# Patient Record
Sex: Female | Born: 1945 | Race: White | Hispanic: No | Marital: Married | Smoking: Never smoker
Health system: Southern US, Community
[De-identification: ages and names within clinical notes are randomized; demographics above are authoritative.]

## PROBLEM LIST (undated history)

## (undated) DIAGNOSIS — R413 Other amnesia: Secondary | ICD-10-CM

## (undated) DIAGNOSIS — R410 Disorientation, unspecified: Secondary | ICD-10-CM

## (undated) DIAGNOSIS — E119 Type 2 diabetes mellitus without complications: Secondary | ICD-10-CM

## (undated) DIAGNOSIS — M1990 Unspecified osteoarthritis, unspecified site: Secondary | ICD-10-CM

## (undated) DIAGNOSIS — R450 Nervousness: Secondary | ICD-10-CM

## (undated) HISTORY — DX: Unspecified osteoarthritis, unspecified site: M19.90

## (undated) HISTORY — DX: Type 2 diabetes mellitus without complications: E11.9

## (undated) HISTORY — DX: Other amnesia: R41.3

## (undated) HISTORY — DX: Disorientation, unspecified: R41.0

## (undated) HISTORY — DX: Nervousness: R45.0

---

## 2007-11-07 ENCOUNTER — Ambulatory Visit: Payer: Self-pay | Admitting: Obstetrics and Gynecology

## 2007-12-12 ENCOUNTER — Encounter (INDEPENDENT_AMBULATORY_CARE_PROVIDER_SITE_OTHER): Payer: Self-pay | Admitting: Gynecology

## 2007-12-12 ENCOUNTER — Ambulatory Visit: Payer: Self-pay | Admitting: Gynecology

## 2008-01-03 ENCOUNTER — Inpatient Hospital Stay (HOSPITAL_COMMUNITY): Admission: RE | Admit: 2008-01-03 | Discharge: 2008-01-04 | Payer: Self-pay | Admitting: Gynecology

## 2008-01-03 ENCOUNTER — Encounter (INDEPENDENT_AMBULATORY_CARE_PROVIDER_SITE_OTHER): Payer: Self-pay | Admitting: Gynecology

## 2008-01-03 ENCOUNTER — Ambulatory Visit: Payer: Self-pay | Admitting: Gynecology

## 2008-01-09 ENCOUNTER — Ambulatory Visit: Payer: Self-pay | Admitting: Gynecology

## 2008-02-20 ENCOUNTER — Ambulatory Visit: Payer: Self-pay | Admitting: Obstetrics and Gynecology

## 2011-01-11 NOTE — Consult Note (Signed)
NAME:  Joyce Perry, Joyce Perry NO.:  [id number]   MEDICAL RECORD NO.:  [id number]          PATIENT TYPE:  WOC   LOCATION:  WOC                          FACILITY:  [facility]   PHYSICIAN:  Maylon Cos, C.N.M. DATE OF BIRTH:  02/10/1946   DATE OF CONSULTATION:  DATE OF DISCHARGE:                                 CONSULTATION   The patient presents today to the GYN Clinic at [facility] on  November 07, 2007 as a self referral patient.   CHIEF COMPLAINT:  Something is coming out 3-4 times a week between her  legs. The patient states that she has been noticing this problem for  approximately 3-4 months, and it happens 3-4 times a week. She denies  any pain, any vaginal bleeding, vaginal discharge, or vaginal  irritation. Reviewing her gynecological history, she has no known  allergies. She is not currently taking any medications. Her  immunizations are not up to date. She went through menopause  approximately 11 years ago. She has never been on any contraceptives.  She has been pregnant twice and had two vaginal deliveries, the last of  which was in 1967. It has been several years since her last Pap smear.  She has never had a mammogram or a colonoscopy. She has no history of  any operations. Her family history is significant for diabetes in both  of her parents, her grandparents, and her sister, and she has a  significant history for high blood pressure in her grandmother. Personal  medical history is only positive for what she thinks is arthritis;  however, she has never sought medical attention for any of this, and  this is a self diagnosis.   SOCIAL HISTORY:  She is married and has been  married for 46 years to  her husband. She does not currently work outside of the home. She does  not smoke. She does not drink alcohol. She does not drink caffeinated  beverages. She is not being sexually or physically abused. She does not  have any substance or abuse issues.   PHYSICAL EXAMINATION:  Her exam is going to be problem focused, and we  will attempt to address her other health issues and update her on all of  her health maintenance delinquencies at her next visit.  VITAL SIGNS:  On exam today, her temperature is 97. Her pulse is 110.  Her blood pressure is elevated at 180/104, and then on recheck was found  to be 172/106. Her weight today is 130, and her height is 5 feet 2-1/2  inches.  GENERAL:  On exam, Joyce Perry is a well-dressed, pleasant Caucasian  female who appears to be her stated age. She is no apparent distress.  ABDOMINAL:  Negative. She has no masses, nontender.  EXTERNAL GENITALIA:  She is a Tanner V. She has no lesions. Mucous  membranes are atrophic but nonerythematous. Vagina is smooth.  SPECULUM EXAM:  Her cervix is easily visualized using a Graves speculum.  Her tone is lax. Upon bearing down, the patient has a significant  cystocele which is able to be reproduced on several  occasions. The  patient was also noted to have several hemorrhoids rectally. Dr.  Mia Creek was consulted concerning this patient, and he also examined the  patient and confirmed the findings of significant cystocele with little  evidence of rectocele in patient and agreed with diagnosis of  hemorrhoids as well.   ASSESSMENT AND PLAN:  Joyce Perry is a 65 year old Caucasian female.  1. Cystocele. Options for management were reviewed at length by myself      and also Dr. Mia Creek. The patient was given option since she is      not having any problems and is concerned about financial need that      it is not emergent for her to have the cystocele reversed or      corrected seeing that she is currently stable and not having any      complaints related to it. The option of pessary was also discussed,      and the option of surgery was also discussed. Multiple questions      were answered, and the patient would like to proceed with surgery;      however, would like to  investigate the option of Medicaid coverage.      The patient was referred to the Department of Social Services to      apply for Medicaid coverage in the meantime until she will be      eligible for Medicare at the age of 63 in approximately 11 months.  2. Hypertension. It is unclear whether this patient has significant      chronic hypertension or whether as she states this is a white coat      syndrome and it happens any time she interacts with medical care.      The patient was referred to [facility] for general medical workup      and clearance for surgery seeing that it has been 11 years since      her last physical exam and evaluation of her hypertension.  3. Health maintenance. The patient is to return in 6-8 weeks to      discuss her plans and to assess her situation as far as health      insurance coverage. We have also referred her to the financial      counselor to discuss the self pay option for surgical repair of the      cystocele. It was reviewed that the patient should have a      colonoscopy, mammography, and Pap smear. The patient was referred      to the free Pap smear clinic that is being provided by [facility]      over the next several weeks. The patient was also given information      to apply for a scholarship for a free mammogram and was given      information for area colorectal screenings secondary to her      financial. The patient does, however, plan to apply for Medicaid,      and in that case that she does not want to pay out of pocket for      these services, she was instructed to return when she does have      health insurance coverage, public or private, for annual exam and      scheduling of these appointments.      Maylon Cos, C.N.M.     SS/MEDQ  D:  11/07/2007  T:  11/07/2007  Job:  [id number]

## 2011-01-11 NOTE — Group Therapy Note (Signed)
NAME:  Joyce Perry, Joyce Perry NO.:  [id number]   MEDICAL RECORD NO.:  [id number]          PATIENT TYPE:  WOC   LOCATION:  [facility]                   FACILITY:  [facility]   PHYSICIAN:  Ginger Carne, MD DATE OF BIRTH:  [birth year]   DATE OF SERVICE:  12/12/2007                                  CLINIC NOTE   This patient is a 65 year old Caucasian female who was initially seen in  the GYN clinic on 11/07/2007, with Maylon Cos, CNM.  The patient has  complained of vaginal bulging and symptoms of genuine urinary stress  incontinence.  A thorough history and physical is described in Ms.  Shores' note, dated 11/07/2007, and this is an addendum.   I reexplored the options with the patient which were briefly described  at the prior visit due to my time limitation secondary to seeing the  patient between surgeries as to the use of a pessary as opposed to  definitive surgery.  I explained to the patient that because she is  active, there is a chance that the pessary may not remain in place.  Today, I made it very clear that it is a viable option if it can be  sized correctly, but that there are no assurances it may not fall out.  This was briefly discussed with the patient at her prior visit.  Husband  and patient had time to think about this and have elected not to proceed  with a pessary, although I think this would be a fine option for her.   The patient loses urine on coughing, straining and other Valsalva  maneuvers.  She denies loss of urine at rest.  She has no nocturia, has  never had bladder surgery.  Does not take medication that would enhance  her propensity for losing urine and has no neurological or other medical  diseases that would contribute to her urinary loss.  She has no fecal  incontinence.   The patient describes minimal, but present postvoid dribbling.  In  addition, she states that she has noticed significant irritation of the  vaginal canal.   Physical exam reveals external genitalia, vulva and vagina present  second-degree uterine prolapse with a third-degree cystocele, first-  degree rectocele and no evidence of an enterocele.  Upon standing, these  findings are significantly more noticeable than on the recumbent supine  position.  Rectal exam reveals good tone.  Uterus is small, anteverted  and flexed and both adnexa are not palpable.   After once again describing the pros and cons of proceeding with a  pessary, the patient and husband would prefer definitive surgery.  I  think her best option would be a tension-free vaginal tape procedure  with cystoscopy, a total vaginal hysterectomy with bilateral salpingo-  oophorectomy, an anterior colporrhaphy and uterosacral ligament vaginal  vault suspension.  The nature of said procedure discussed in detail,  including possible injury to bowel, hemorrhage possibly requiring a  blood transfusion, injury to ureter and bladder, postoperative urinary  urgency,  urinary retention and graft complications, including erosion, infection  or rejection.  The patient accepts said complications, understands  said  surgery with husband and will proceed with same.           ______________________________  Ginger Carne, MD     SHB/MEDQ  D:  12/12/2007  T:  12/12/2007  Job:  [id number]

## 2011-01-11 NOTE — Discharge Summary (Signed)
NAMEDALY, Joyce Perry              ACCOUNT NO.:  [id number]   MEDICAL RECORD NO.:  [id number]          PATIENT TYPE:  WOC   LOCATION:  WOC                          FACILITY:  [facility]   PHYSICIAN:  Ginger Carne, MD  DATE OF BIRTH:  May 31, 1946   DATE OF ADMISSION:  DATE OF DISCHARGE:                               DISCHARGE SUMMARY   REASON FOR HOSPITALIZATION:  Third-degree uterovaginal prolapse, third-  degree cystocele, and genuine urinary stress incontinence.   IN-HOSPITAL PROCEDURES:  1. Total vaginal hysterectomy.  2. Uterosacral ligament vaginal vault suspension.  3. Tension-free vaginal tape procedure.  4. Cystoscopy.  5. Anterior colporrhaphy.   FINAL DIAGNOSES:  1. Third-degree uterovaginal prolapse.  2. Third-degree cystocele.  3. Genuine urinary stress incontinence.   HOSPITAL COURSE:  This patient is a 65 year old Caucasian female who  underwent the aforementioned procedures on Jan 03, 2008.  Her  postoperative course was unremarkable.  She was afebrile.  Hemoglobin  10.3 from 13.0 preop and creatinine 0.61.  She has scant vaginal flow.  Lungs clear.  Abdomen soft.  Calves were without tenderness.  The  patient will have her Foley catheter removed in the a.m. and will  have  a voiding trial.  If she is unable to void and have a residual urine  volume less than 75 mL, her Foley catheter will be replaced with a leg  bag and return on Jan 09, 2008, at 1 p.m. at the [facility] for removal.  Otherwise, the patient will have her catheter removed, able to void and  was instructed regarding voiding trials every 4 hours for the next few  weeks to avoid urinary overflow.   Other instructions including contacting the office for temperature  elevation above 104 degrees Fahrenheit, increasing abdominal or pelvic  pain, vaginal bleeding, GI or GU complaints, urinary retention, or  difficulty with bowel movements.  She was prescribed Percocet 5/325 1-2  every 4-6 hours for  pain, ibuprofen as needed, and Levaquin 500 mg 1  daily for 7 days.  If the patient is able to have the satisfactory void  trail this morning, she will return at 4 weeks to the GYN Clinic.      Ginger Carne, MD  Electronically Signed     SHB/MEDQ  D:  01/04/2008  T:  01/04/2008  Job:  [id number]

## 2011-01-11 NOTE — Op Note (Signed)
Joyce Perry, Joyce Perry              ACCOUNT NO.:  [id number]   MEDICAL RECORD NO.:  [id number]          PATIENT TYPE:  INP   LOCATION:  9319                          [facility]   PHYSICIAN:  Ginger Carne, MD  DATE OF BIRTH:  [birth year]   DATE OF PROCEDURE:  01/03/2008  DATE OF DISCHARGE:                               OPERATIVE REPORT   PREOPERATIVE DIAGNOSES:  1. Third-degree uterovaginal prolapse.  2. Third-degree cystocele.  3. Genuine urinary stress incontinence.   POSTOPERATIVE DIAGNOSES:  1. Third-degree uterovaginal prolapse.  2. Third-degree cystocele.  3. Genuine urinary stress incontinence.   PROCEDURE:  1. Total vaginal hysterectomy.  2. Uterosacral ligament vaginal vault suspension.  3. Tension-free vaginal tape procedure with cystoscopy.   SURGEON:  Ginger Carne, MD   ASSISTANT:  Javier Glazier. Rose, MD.   ESTIMATED BLOOD LOSS:  150 mL.   COMPLICATIONS:  None immediate.   SPECIMEN:  Uterus and cervix.   DISPOSITION:  To pathology.   OPERATIVE FINDINGS:  The patient demonstrated a third-degree  uterovaginal prolapse, third-degree cystocele, first-degree rectocele,  and history of genuine urinary stress incontinence.  The uterus was  atrophic with a long postmenopausal cervix.  Both tubes and ovaries  appeared normal and left in situ due to their high position.   OPERATIVE PROCEDURE:  The patient was prepped and draped in usual  fashion and placed in lithotomy position.  Betadine solution was used  for antiseptic and the patient was catheterized prior to the procedure.  After adequate general anesthesia, a tenaculum was placed on the  anterior posterior lips of the cervix.  Marcaine with epinephrine was  used as a paracervical block.  2 cm of anterior and posterior vaginal  epithelium were incised transversely.  The peritoneal reflections were  identified and opened without injury to their respective organs.  Uterosacral-cardinal ligament complex was  clamped, cut, and ligated with  0 Vicryl suture.  This extended to the uterine vasculature in a standard  fashion.  At this point, the utero-ovarian ligaments including the round  ligament and tubes were clamped, cut, and ligated with 0 Vicryl suture  in a transfixation manner on either side.  No active bleeding was noted.   The uterosacral ligament vaginal vault suspension was then carried out  by placing an index finger in the rectum.  The said ligaments were  identified at the 5 and 7 o'clock position.  Double looped 0 Prolene  placed on either side.  Immediately afterwards cystoscopy with indigo  carmine was performed.  Tension was placed on said sutures and good  spillage of dye through both ureteral orifices noted.  Afterwards, the  sutures were left on hemostats to be later affixed to the posterior  aspect of the vaginal epithelium to their respective peritoneum at the 5  and 7 o'clock positions.   An anterior colporrhaphy was performed by dividing the anterior vaginal  epithelium in the midline.  The pubovesical cervical fascia was then  dissected off said epithelium.  The space of Retzius was then identified  and using a [employer] scientific advantage bottom up technique,  the TVT  tapes were then placed about 1 cm lateral from the symphysis pubis and  emanated in the pelvic region hugging the posterior aspect of the pubic  bone.  Immediate cystoscopy was then performed.  No injury was noted to  the urethra, bladder sidewalls, trigone, dome, or lateral walls of the  bladder.  At this point, a standard anterior colporrhaphy approximating  the pubovesical cervical fashion in the midline with 2-0 Prolene  interrupted sutures was performed.  Afterwards appropriate tensioning  with the TVT tape was then performed.  The plastic sheaths were then  removed.  There was an adequate loop beneath the said urethra in the  midportion of the urethra.  Following this, bleeding points   hemostatically were checked.  Copious irrigation with lactated Ringer's  followed.  Minimal trimming was necessary to the anterior vaginal  epithelium.  At this point, the anterior vaginal epithelium was closed  with 0 Monocryl running interlocking suture.  As described above, the  uterosacral ligament sutures were then affixed to the posterior vaginal  epithelium and their respective peritoneal reflections at the 5 and 7  o'clock positions.  Closure of the cuff was with a 0 Vicryl running  interlocking suture.  Afterwards, the Prolene sutures were then tied up  to the uterosacral ligaments on either side.  No active bleeding noted.  Urine was clear at the end of the procedure.  The patient tolerated the  procedure well and returned to postanesthesia recovery room in excellent  condition.      Ginger Carne, MD  Electronically Signed     SHB/MEDQ  D:  01/03/2008  T:  01/04/2008  Job:  [id number]

## 2011-01-11 NOTE — Group Therapy Note (Signed)
NAMEHALIMA, [name] NO.:  [id number]   MEDICAL RECORD NO.:  [id number]          PATIENT TYPE:  WOC   LOCATION:  [facility]                   [facility]ACILITY:  [facility]   PHYSICIAN:  Argentina Donovan, MD        DATE OF BIRTH:  1946/08/16   DATE OF SERVICE:  02/20/2008                                  CLINIC NOTE   The patient is a 65 year old Caucasian female who 6 weeks ago underwent  total vaginal hysterectomy, uterosacral ligament vaginal vault  suspension, tension-free vaginal tape procedure, cystoscopy and anterior  colporrhaphy.  The results of the surgery have been remarkable for the  patient.  She is voiding well and having no incontinence at all.  She  feels much less pelvic pressure than she felt prior to the surgery, and  she feels quite well.  On examination, the abdomen is soft, flat and  nontender.  No masses or organomegaly.  External genitalia is normal and  atrophic.  BUS area was atrophic.  The introitus was slightly stenotic.  A speculum was used to visualize the apex.  There was still some sutures  that had not absorbed as yet, and I warned the patient about seeing them  when they came out and not to be alarmed, but healing seems to be  absolutely appropriate for this lady's age and secondary vaginal  atrophy.  The postoperative condition is more than satisfactory.  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  I have told her she needs a medical workup, and she needs to  be seen by an internal medicine specialist because she is not qualified  for Medicaid as yet because of age and is living [employer] surgeon.  She  hesitates to do this, although she said that the hospital treated very  well in regards to the surgery.  We are referring her to [facility],  and hopefully they can work her up and handle her arthritis and find out  what the etiology may  be.           ______________________________  Argentina Donovan, MD     PR/MEDQ  D:  02/20/2008  T:  02/20/2008  Job:  [id number]

## 2011-01-11 NOTE — Group Therapy Note (Signed)
NAMEMarland Kitchen  LIS, SAVITT NO.:  [id number]   MEDICAL RECORD NO.:  [id number]          PATIENT TYPE:  WOC   LOCATION:  [facility]                   [facility]   PHYSICIAN:  Ginger Carne, MD DATE OF BIRTH:  11-02-1945   DATE OF SERVICE:  01/09/2008                                  CLINIC NOTE   Joyce Perry returns today for follow-up after a total vaginal  hysterectomy, uterosacral ligament vaginal vault suspension, tension-  free vaginal tape procedure, cystoscopy and anterior colporrhaphy.  The  patient's Foley catheter was removed today.  She was able to void  satisfactorily twice without complaints of pelvic pain and the patient  felt she emptied completely.  She was advised to continue timed voids  every 4 hours for the next 4 weeks and will return in 4 weeks for her  postoperative visit.  The patient had no other complaints and is doing  well.  She has minimal discharge and, otherwise, feels well.           ______________________________  Ginger Carne, MD     SHB/MEDQ  D:  01/09/2008  T:  01/09/2008  Job:  [id number]

## 2011-05-24 LAB — POCT URINALYSIS DIP (DEVICE)
Bilirubin Urine: NEGATIVE
Glucose, UA: NEGATIVE
Hgb urine dipstick: NEGATIVE
Ketones, ur: NEGATIVE
Specific Gravity, Urine: 1.015

## 2019-07-15 ENCOUNTER — Emergency Department (HOSPITAL_COMMUNITY): Payer: Medicare Other

## 2019-07-15 ENCOUNTER — Emergency Department (HOSPITAL_COMMUNITY)
Admission: EM | Admit: 2019-07-15 | Discharge: 2019-07-15 | Disposition: A | Discharge status: Home / Self Care | Payer: Medicare Other | Attending: Emergency Medicine | Admitting: Emergency Medicine

## 2019-07-15 DIAGNOSIS — R4182 Altered mental status, unspecified: Secondary | ICD-10-CM | POA: Diagnosis present

## 2019-07-15 DIAGNOSIS — R410 Disorientation, unspecified: Secondary | ICD-10-CM | POA: Insufficient documentation

## 2019-07-15 DIAGNOSIS — R404 Transient alteration of awareness: Secondary | ICD-10-CM | POA: Diagnosis not present

## 2019-07-15 DIAGNOSIS — Z20828 Contact with and (suspected) exposure to other viral communicable diseases: Secondary | ICD-10-CM | POA: Diagnosis not present

## 2019-07-15 LAB — COMPREHENSIVE METABOLIC PANEL
ALT: 13 U/L (ref 0–44)
AST: 20 U/L (ref 15–41)
Albumin: 3.7 g/dL (ref 3.5–5.0)
Alkaline Phosphatase: 69 U/L (ref 38–126)
Anion gap: 14 (ref 5–15)
BUN: 6 mg/dL — ABNORMAL LOW (ref 8–23)
CO2: 24 mmol/L (ref 22–32)
Calcium: 9.2 mg/dL (ref 8.9–10.3)
Chloride: 104 mmol/L (ref 98–111)
Creatinine, Ser: 0.71 mg/dL (ref 0.44–1.00)
GFR calc Af Amer: >60 mL/min (ref >60)
GFR calc non Af Amer: >60 mL/min (ref >60)
Glucose, Bld: 91 mg/dL (ref 70–99)
Potassium: 3.3 mmol/L — ABNORMAL LOW (ref 3.5–5.1)
Sodium: 142 mmol/L (ref 135–145)
Total Bilirubin: 0.8 mg/dL (ref 0.3–1.2)
Total Protein: 6.6 g/dL (ref 6.5–8.1)

## 2019-07-15 LAB — CBC WITH DIFFERENTIAL/PLATELET
Abs Immature Granulocytes: 0.01 10*3/uL (ref 0.00–0.07)
Basophils Absolute: 0 10*3/uL (ref 0.0–0.1)
Basophils Relative: 1 %
Eosinophils Absolute: 0 10*3/uL (ref 0.0–0.5)
Eosinophils Relative: 1 %
HCT: 35.2 % — ABNORMAL LOW (ref 36.0–46.0)
Hemoglobin: 11.5 g/dL — ABNORMAL LOW (ref 12.0–15.0)
Immature Granulocytes: 0 %
Lymphocytes Relative: 40 %
Lymphs Abs: 1.9 10*3/uL (ref 0.7–4.0)
MCH: 31.5 pg (ref 26.0–34.0)
MCHC: 32.7 g/dL (ref 30.0–36.0)
MCV: 96.4 fL (ref 80.0–100.0)
Monocytes Absolute: 0.4 10*3/uL (ref 0.1–1.0)
Monocytes Relative: 9 %
Neutro Abs: 2.3 10*3/uL (ref 1.7–7.7)
Neutrophils Relative %: 49 %
Platelets: 201 10*3/uL (ref 150–400)
RBC: 3.65 MIL/uL — ABNORMAL LOW (ref 3.87–5.11)
RDW: 12.7 % (ref 11.5–15.5)
WBC: 4.7 10*3/uL (ref 4.0–10.5)
nRBC: 0 % (ref 0.0–0.2)

## 2019-07-15 LAB — PROTIME-INR
INR: 1.1 (ref 0.8–1.2)
Prothrombin Time: 14 seconds (ref 11.4–15.2)

## 2019-07-15 LAB — SARS CORONAVIRUS 2 (TAT 6-24 HRS): SARS Coronavirus 2: NEGATIVE

## 2019-07-15 LAB — AMMONIA: Ammonia: <9 umol/L — ABNORMAL LOW (ref 9–35)

## 2019-07-15 LAB — APTT: aPTT: 31 seconds (ref 24–36)

## 2019-07-15 LAB — ETHANOL: Alcohol, Ethyl (B): <10 mg/dL (ref <10)

## 2019-07-15 NOTE — ED Provider Notes (Signed)
[place] EMERGENCY DEPARTMENT Provider Note   CSN: [id number] Arrival date & time: 07/15/19  1437     History   Chief Complaint Chief Complaint  Patient presents with  . Altered Mental Status    HPI Joyce Perry is a 73 y.o. female.     HPI 73 year old female presents with acute altered mental status.  History is somewhat limited as the patient does not know exactly what happened.  According to the nurse, EMS reported that they were called to [facility] because she was altered and did not know where she was or who she was.  Patient tells me she remembers going to [facility] but does not remember what happened there.  Here she is alert and oriented.  She denies any acute complaints currently. She seemed to slowly improve and now she has normal mental status.  No past medical history on file.  There are no active problems to display for this patient.      OB History   No obstetric history on file.      Home Medications    Prior to Admission medications   Not on File    Family History No family history on file.  Social History Social History   Tobacco Use  . Smoking status: Not on file  Substance Use Topics  . Alcohol use: Not on file  . Drug use: Not on file     Allergies   Patient has no allergy information on record.   Review of Systems Review of Systems  Constitutional: Negative for fever.  Respiratory: Negative for shortness of breath.   Cardiovascular: Negative for chest pain.  Gastrointestinal: Negative for abdominal pain.  Neurological: Negative for weakness, numbness and headaches.  Psychiatric/Behavioral: Positive for confusion.  All other systems reviewed and are negative.    Physical Exam Updated Vital Signs BP (!) 154/61 (BP Location: Right Arm)   Pulse 68   Temp 98.6 F (37 C) (Oral)   Resp 16   SpO2 100%   Physical Exam Vitals signs and nursing note reviewed.  Constitutional:      General: She is not in acute  distress.    Appearance: She is well-developed. She is not ill-appearing or diaphoretic.  HENT:     Head: Normocephalic and atraumatic.     Right Ear: External ear normal.     Left Ear: External ear normal.     Nose: Nose normal.  Eyes:     General:        Right eye: No discharge.        Left eye: No discharge.     Extraocular Movements: Extraocular movements intact.     Pupils: Pupils are equal, round, and reactive to light.  Cardiovascular:     Rate and Rhythm: Normal rate and regular rhythm.     Heart sounds: Normal heart sounds.  Pulmonary:     Effort: Pulmonary effort is normal.     Breath sounds: Normal breath sounds.  Abdominal:     Palpations: Abdomen is soft.     Tenderness: There is no abdominal tenderness.  Skin:    General: Skin is warm and dry.  Neurological:     Mental Status: She is alert and oriented to person, place, and time.     Comments: Alert, oriented to person, place, time. CN 3-12 grossly intact. 5/5 strength in all 4 extremities. Grossly normal sensation. Normal finger to nose.   Psychiatric:  Mood and Affect: Mood is not anxious.      ED Treatments / Results  Labs (all labs ordered are listed, but only abnormal results are displayed) Labs Reviewed  COMPREHENSIVE METABOLIC PANEL - Abnormal; Notable for the following components:      Result Value   Potassium 3.3 (*)    BUN 6 (*)    All other components within normal limits  CBC WITH DIFFERENTIAL/PLATELET - Abnormal; Notable for the following components:   RBC 3.65 (*)    Hemoglobin 11.5 (*)    HCT 35.2 (*)    All other components within normal limits  SARS CORONAVIRUS 2 (TAT 6-24 HRS)  ETHANOL  PROTIME-INR  APTT  RAPID URINE DRUG SCREEN, HOSP PERFORMED  URINALYSIS, ROUTINE W REFLEX MICROSCOPIC  AMMONIA    EKG EKG Interpretation  Date/Time:  Monday July 15 2019 14:54:05 EST Ventricular Rate:  54 PR Interval:    QRS Duration: 98 QT Interval:  432 QTC Calculation: 410 R  Axis:   68 Text Interpretation: Sinus rhythm rate is slower, otherwise similar to 2009 Confirmed by Pricilla Loveless [phone]) on 07/15/2019 3:11:19 PM   Radiology No results found.  Procedures Procedures (including critical care time)  Medications Ordered in ED Medications - No data to display   Initial Impression / Assessment and Plan / ED Course  I have reviewed the triage vital signs and the nursing notes.  Pertinent labs & imaging results that were available during my care of the patient were reviewed by me and considered in my medical decision making (see chart for details).        Patient presents with transient altered mental status. Unclear exact time of onset or cause. Perhaps global amnesia? No acute stroke symptoms/signs now, appears alert/oriented. Care to Dr. Madilyn Hook with CT and likely neuro consult pending.   Final Clinical Impressions(s) / ED Diagnoses   Final diagnoses:  None    ED Discharge Orders    None       Pricilla Loveless, MD 07/15/19 [phone]

## 2019-07-15 NOTE — ED Provider Notes (Signed)
Patient care assumed at 1600. Patient here for evaluation following acute confusion [employer]. She is back at her neurologic baseline and ED arrival. Labs and CT scan pending.  CT scan demonstrates remote cerebellar infarct, labs without acute abnormalities. Discussed the case with Dr. Cheral Marker with neurology, who recommends MRI as well as outpatient EEG.  Discussed the patient with her son, Ann Maki. Son would like to take the patient home and have MRI performed as an outpatient. He states that she has experienced multiple similar episodes in the recent past. He states that he believes that these are panic attacks that she has when she is out and she becomes confused. Discussed the findings of the CT scan of remote cerebellar infarct, recommendation for EEG. Patient has not provided a urine in the emergency department, she has no dysuria or urinary symptoms. Discussed importance of PCP follow-up as well as repeat emergency department evaluation if she has worsening or new symptoms.   Quintella Reichert, MD 07/15/19 1950

## 2019-07-15 NOTE — ED Notes (Signed)
Pt resting quietly at this time.  No complaints voiced 

## 2019-07-15 NOTE — ED Notes (Signed)
[place]  Son  [phone]

## 2019-07-15 NOTE — ED Notes (Signed)
[place]  Son  [phone] 

## 2019-07-15 NOTE — ED Triage Notes (Signed)
Pt was at [facility] , came in very disoriented, confused when speaking with employees/  With EMS she was alert and oriented X2-3. Changing over time.   Pt from home and is the primary caregiver for husband who is disabled.   HR 60 BP 164/86 RR 18 O2 100 RA CGB 92

## 2019-07-15 NOTE — Discharge Instructions (Addendum)
You had a CT scan performed in the emergency department today that showed that you have had a possible stroke in the past. Please follow-up with your family doctor for further evaluation. You may need an MRI of your brain or an EEG. Get rechecked immediately if you have any new or concerning symptoms.

## 2019-08-09 ENCOUNTER — Ambulatory Visit: Payer: Self-pay | Admitting: Family

## 2019-09-23 ENCOUNTER — Encounter (INDEPENDENT_AMBULATORY_CARE_PROVIDER_SITE_OTHER): Payer: Self-pay

## 2019-09-23 ENCOUNTER — Other Ambulatory Visit: Payer: Self-pay

## 2019-09-23 ENCOUNTER — Encounter: Payer: Self-pay | Admitting: Internal Medicine

## 2019-09-23 ENCOUNTER — Ambulatory Visit (INDEPENDENT_AMBULATORY_CARE_PROVIDER_SITE_OTHER): Payer: Medicare Other | Admitting: Internal Medicine

## 2019-09-23 ENCOUNTER — Ambulatory Visit: Payer: Self-pay | Admitting: Nurse Practitioner

## 2019-09-23 VITALS — BP 138/78 | HR 66 | Temp 97.3°F | Ht 63.0 in | Wt 110.0 lb

## 2019-09-23 DIAGNOSIS — Z8673 Personal history of transient ischemic attack (TIA), and cerebral infarction without residual deficits: Secondary | ICD-10-CM | POA: Diagnosis not present

## 2019-09-23 DIAGNOSIS — M79671 Pain in right foot: Secondary | ICD-10-CM

## 2019-09-23 DIAGNOSIS — E119 Type 2 diabetes mellitus without complications: Secondary | ICD-10-CM

## 2019-09-23 DIAGNOSIS — K5904 Chronic idiopathic constipation: Secondary | ICD-10-CM

## 2019-09-23 DIAGNOSIS — M79672 Pain in left foot: Secondary | ICD-10-CM

## 2019-09-23 DIAGNOSIS — R413 Other amnesia: Secondary | ICD-10-CM | POA: Diagnosis not present

## 2019-09-23 NOTE — Patient Instructions (Signed)
It's ok to try eating prunes for constipation.  Drink more water. If you're still constipated, take a scoop of miralax daily in 8oz of water.

## 2019-09-23 NOTE — Progress Notes (Signed)
Location:  [facility] clinic  Provider: Dr. Bufford Spikes   Goals of Care:  Advanced Directives 09/23/2019  Does Patient Have a Medical Advance Directive? Yes  Type of Advance Directive Healthcare Power of Attorney  Does patient want to make changes to medical advance directive? No - Patient declined  Would patient like information on creating a medical advance directive? -     Chief Complaint  Patient presents with  . Establish Care    New patient estabalishing care. , confussion     HPI: Patient is a 74 y.o. female seen today for establishment of care.    [place], son, present for visit.   Memory issues- son claims she has had memory issues for the past few years, but in the last 6 months it has progressed. Son denies any behavioral outbursts or hallucinations. Son states she will occasionally have panic attack.  In November 2020, she drove herself to pharmacy and was confused. She was taken to [facility] ED for confusion. A CT of head was done demonstrating cerebellar infarct, MRI and EEG was recommended.   Bilateral foot pain- feet hurt when putting weight on them. Son would like a podiatry referral for management of toenails and possible callus.   She lives in a single family house with her husband. Her husband is in a wheelchair permanently. She is his main caregiver. Her son lives in [place] and visits them once a week. Son is trying to convince them to move closer.   Son buys groceries and states they are eating a lot. Her husband and her will go through a tub of butter within a two week period. Son is not sure if she forgets to eat and is over eating. She will cook everyday and prepare meals for her and husband. Cannot remember if she has burnt food or any fire related issues.   Diabetes- she claims a lot of family members had diabetes. She cannot state which ones. Does not know if she has been tested for diabetes in the past.   Constipation- she states she is up at night trying  to use the bathroom.   Refusing to have flu vaccine, states she does not want one. Unsure about what covid is.   She quit driving in November 0626.   Denies any recent falls or injuries. Walkways of the house are clear. She has a raised toilet with grab bars. Will use a front wheeled walker. Son states she is more unstable when ambulating.   Son cannot state last eye exam.   Has never had a colonoscopy  Son thinks she may of had a mammogram in her 2's.   Son states mother has living will and HOPA.          Past Medical History:  Diagnosis Date  . Arthritis   . Confusion   . Diabetes (HCC)   . Memory loss   . Nervousness     History reviewed. No pertinent surgical history.  No Known Allergies  No outpatient encounter medications on file as of 09/23/2019.   No facility-administered encounter medications on file as of 09/23/2019.    Review of Systems:  Review of Systems  Constitutional: Positive for appetite change. Negative for activity change and fatigue.  HENT: Positive for dental problem. Negative for hearing loss and trouble swallowing.        Upper/lower dentures  Eyes: Negative for photophobia and visual disturbance.       Eyeglasses  Respiratory: Negative for cough  and shortness of breath.   Cardiovascular: Negative for chest pain and leg swelling.  Gastrointestinal: Positive for constipation. Negative for abdominal pain, diarrhea and nausea.  Genitourinary: Negative for dysuria and hematuria.  Musculoskeletal: Negative.        Sharp pain in bilateral toes/feet. Generalized arthritis.   Skin: Negative.   Neurological: Positive for dizziness and weakness. Negative for headaches.  Psychiatric/Behavioral: Positive for confusion and sleep disturbance. Negative for dysphoric mood. The patient is nervous/anxious.        Insomnia. nervousness    Health Maintenance  Topic Date Due  . Hepatitis C Screening  15-Jul-1946  . COLONOSCOPY  10/06/1993  . DEXA SCAN   10/06/2008  . PNA vac Low Risk Adult (1 of 2 - PCV13) 10/06/2008  . INFLUENZA VACCINE  Discontinued  . TETANUS/TDAP  Discontinued    Physical Exam: Vitals:   09/23/19 1348  BP: 138/78  Pulse: 66  Temp: (!) 97.3 F (36.3 C)  TempSrc: Temporal  SpO2: 99%  Weight: 110 lb (49.9 kg)  Height: 5\' 3"  (1.6 m)   Body mass index is 19.49 kg/m. Physical Exam Vitals reviewed.  Constitutional:      General: She is not in acute distress.    Appearance: Normal appearance. She is normal weight.  HENT:     Head: Normocephalic.  Cardiovascular:     Rate and Rhythm: Normal rate and regular rhythm.     Pulses: Normal pulses.     Heart sounds: Normal heart sounds. No murmur.  Pulmonary:     Effort: Respiratory distress present.     Breath sounds: Normal breath sounds. No wheezing.  Abdominal:     General: Abdomen is flat. Bowel sounds are normal. There is no distension.     Palpations: Abdomen is soft.     Tenderness: There is no abdominal tenderness.  Musculoskeletal:        General: Normal range of motion.     Right lower leg: No edema.     Left lower leg: No edema.  Feet:     Right foot:     Protective Sensation: 10 sites tested. 10 sites sensed.     Skin integrity: Callus and dry skin present.     Toenail Condition: Right toenails are normal.     Left foot:     Protective Sensation: 10 sites tested. 10 sites sensed.     Skin integrity: Callus and dry skin present.     Toenail Condition: Left toenails are abnormally thick.     Comments: Callus x 2 on left and right foot. Hammer toe to second left toe. Hammer toe to second right toe.  Skin:    General: Skin is warm and dry.     Capillary Refill: Capillary refill takes less than 2 seconds.     Comments: Scattered bruising with abrasion on left and right shin.   Neurological:     General: No focal deficit present.     Mental Status: She is alert. Mental status is at baseline. She is disoriented.     Motor: Weakness present.      Coordination: Coordination abnormal.     Gait: Gait abnormal.  Psychiatric:        Mood and Affect: Mood normal.        Behavior: Behavior normal.        Thought Content: Thought content normal.        Judgment: Judgment normal.     Labs reviewed: Basic Metabolic Panel: Recent Labs  07/15/19 1537  NA 142  K 3.3*  CL 104  CO2 24  GLUCOSE 91  BUN 6*  CREATININE 0.71  CALCIUM 9.2   Liver Function Tests: Recent Labs    07/15/19 1537  AST 20  ALT 13  ALKPHOS 69  BILITOT 0.8  PROT 6.6  ALBUMIN 3.7   No results for input(s): LIPASE, AMYLASE in the last 8760 hours. Recent Labs    07/15/19 1538  AMMONIA <9*   CBC: Recent Labs    07/15/19 1537  WBC 4.7  NEUTROABS 2.3  HGB 11.5*  HCT 35.2*  MCV 96.4  PLT 201   Lipid Panel: No results for input(s): CHOL, HDL, LDLCALC, TRIG, CHOLHDL, LDLDIRECT in the last 8760 hours. No results found for: HGBA1C  Procedures since last visit: No results found.  Assessment/Plan 1. Foot pain, bilateral - large callus and hammertoe present on bilateral feet - toenail abnormally thick on left foot - ambulatory referral to podiatry - continue to wear comfortable shoes  2. Controlled type 2 diabetes mellitus without complication, without long-term current use of insulin (HCC) - she has a family history of diabetes but does not know if direct relatives are involved - unsure if she is overeating due to memory issues - hemoglobin A1C- future - basic metabolic panel- future - cbc with differential/platelets- future - lipid panel- future - foot exam- today  3. Memory loss - she is orientated to person, place and year, she is disorientated to situation. In addition, she shows signs of expressive aphasia or word salad - referral to home health for PT and safety check of home - MMSE- future  4. History of cerebellar stroke - suspect memory issues are due to past stroke - f/u EEG and MRI suggested, not done  5. Chronic  idiopathic constipation - suspect her poor hydration is contributing to constipation - encourage drinking 6-8 glasses of water daily - eat prunes on regular basis - may use stool softner or miralax for regular bowel movements   Labs/tests ordered:  Cbc with differential/platelets, basic metabolic panel, hemoglobin A1C, lipid panel, TSH, vitamin B12, hepatic panel- future Next appt:  Visit date not found

## 2019-10-17 ENCOUNTER — Ambulatory Visit: Payer: Medicare Other | Admitting: Podiatry

## 2019-10-17 ENCOUNTER — Ambulatory Visit: Payer: Medicare Other | Admitting: Internal Medicine

## 2019-10-21 ENCOUNTER — Other Ambulatory Visit: Payer: Self-pay

## 2019-10-24 ENCOUNTER — Other Ambulatory Visit: Payer: Medicare Other

## 2019-10-24 ENCOUNTER — Ambulatory Visit (INDEPENDENT_AMBULATORY_CARE_PROVIDER_SITE_OTHER): Payer: Medicare Other | Admitting: Internal Medicine

## 2019-10-24 ENCOUNTER — Ambulatory Visit: Payer: Medicare Other | Admitting: Podiatry

## 2019-10-24 ENCOUNTER — Other Ambulatory Visit: Payer: Self-pay

## 2019-10-24 ENCOUNTER — Encounter: Payer: Self-pay | Admitting: Internal Medicine

## 2019-10-24 VITALS — BP 130/62 | HR 59 | Temp 97.7°F | Ht 63.0 in | Wt 105.8 lb

## 2019-10-24 DIAGNOSIS — Z8673 Personal history of transient ischemic attack (TIA), and cerebral infarction without residual deficits: Secondary | ICD-10-CM | POA: Diagnosis not present

## 2019-10-24 DIAGNOSIS — Q828 Other specified congenital malformations of skin: Secondary | ICD-10-CM | POA: Diagnosis not present

## 2019-10-24 DIAGNOSIS — F0150 Vascular dementia without behavioral disturbance: Secondary | ICD-10-CM | POA: Insufficient documentation

## 2019-10-24 DIAGNOSIS — M79676 Pain in unspecified toe(s): Secondary | ICD-10-CM | POA: Diagnosis not present

## 2019-10-24 DIAGNOSIS — M79609 Pain in unspecified limb: Secondary | ICD-10-CM

## 2019-10-24 DIAGNOSIS — R413 Other amnesia: Secondary | ICD-10-CM

## 2019-10-24 DIAGNOSIS — B351 Tinea unguium: Secondary | ICD-10-CM

## 2019-10-24 DIAGNOSIS — E119 Type 2 diabetes mellitus without complications: Secondary | ICD-10-CM

## 2019-10-24 MED ORDER — DONEPEZIL HCL 5 MG PO TABS: 5.0000 mg | ORAL_TABLET | Freq: Every day | ORAL | 3 refills | Status: DC

## 2019-10-24 NOTE — Progress Notes (Signed)
  Subjective:  Patient ID: Joyce Perry, female    DOB: 04-24-1946,  MRN: [id number]  Chief Complaint  Patient presents with  . Foot Pain    Bilateral plantar forefoot. Calluses. x"Years". Pt stated, "I'm on my feet all day, so this part of my feet throbs". Son stated, "The pain seems to be severe. Sometimes she jumps up from the pain, and that puts her at risk for falls".  . Diabetes    Pt's son stated, "She was diagnosed with diabetes years ago, but she has not seen a doctor in years. We are going to an appointment today for labs".    74 y.o. female presents with the above complaint. History confirmed with patient.   Objective:  Physical Exam: warm, good capillary refill, no trophic changes or ulcerative lesions, normal DP and PT pulses and normal sensory exam porokeratoses submet 2 bilat painful thickened toenails  Assessment:   1. Pain due to onychomycosis of nail   2. Porokeratosis    Plan:  Patient was evaluated and treated and all questions answered.  Porokeratoses -Lesions debrided and destroyed as below -F/u PRN -Educated on self care  Procedure: Destruction of Lesion Location: bilateral forefoot Anesthesia: none Instrumentation: 15 blade. Technique: Debridement of lesion Small amount of salinocaine applied to the base of the lesion. Dressing: Dry, sterile, compression dressing. Disposition: Patient tolerated procedure well. Advised to leave dressing on for 6-8 hours. Thereafter patient to wash the area with soap and water and applied band-aid.  Onychomycosis with Pain -Nails debrided x10  No follow-ups on file.   MDM

## 2019-10-24 NOTE — Progress Notes (Signed)
Location:  [facility] clinic Provider:  Derrich Gaby L. Mariea Clonts, D.O., C.M.D.  Goals of Care:  Advanced Directives 09/23/2019  Does Patient Have a Medical Advance Directive? Yes  Type of Advance Directive [place]  Does patient want to make changes to medical advance directive? No - Patient declined  Would patient like information on creating a medical advance directive? -   Chief Complaint  Patient presents with  . Acute Visit    MMSE Test 17/30 and labs prior     HPI: Patient is a 74 y.o. female who recently established care seen today for MMSE and to get her labs done (had none in a long time).    She scored 17/30 and failed her clock drawing.  She struggled with orientation, concentration, recall, and sentence.    Her feet feel better after getting her toenails trimmed.    She has lost over 4 lbs since last visit.  She's fasting.  She's had some diarrhea the past few days.   Pt thinks it was last week now.  Not clear.    Her son has had an in-home assessment by a company [facility] [employer] that came in and evaluated their home safety and situation for $75/hr and they are continuing to help him get more support arranged for his parents.  They never heard from any of the home health agencies after the home health referral was placed.  It looks like one said they did not have enough staff and then referral was sent to [facility], but he's not gotten a call.  I asked pt today what she would do in an emergency at home like a fire.  First she said she'd throw water on it, but her son said what if it was too big for that?  She said after quite a bit of thought that she'd have to find a way to get her husband out (who is in a wheelchair since a failed back surgery), then herself.  I asked if she'd call anyone and she said she guessed she should call the emergency people.  I asked at what number and, again, after quite a long time of thinking, she said 911.    Past Medical History:    Diagnosis Date  . Arthritis   . Confusion   . Diabetes ([place])   . Memory loss   . Nervousness     History reviewed. No pertinent surgical history.  No Known Allergies  No outpatient encounter medications on file as of 10/24/2019.   No facility-administered encounter medications on file as of 10/24/2019.    Review of Systems:  Review of Systems  Constitutional: Negative for chills, fever and weight loss.  HENT: Negative for hearing loss.   Eyes: Negative for blurred vision.       Glasses  Respiratory: Negative for cough and shortness of breath.   Cardiovascular: Negative for chest pain, palpitations and leg swelling.  Gastrointestinal: Negative for abdominal pain, blood in stool, constipation, diarrhea and melena.  Genitourinary: Negative for dysuria.  Musculoskeletal: Positive for joint pain. Negative for falls.  Skin: Negative for itching and rash.  Neurological: Negative for dizziness and loss of consciousness.  Endo/Heme/Allergies: Does not bruise/bleed easily.  Psychiatric/Behavioral: Positive for memory loss. Negative for depression. The patient is not nervous/anxious and does not have insomnia.     Health Maintenance  Topic Date Due  . HEMOGLOBIN A1C  04/01/1946  . Hepatitis C Screening  11/30/1945  . OPHTHALMOLOGY EXAM  10/06/1953  . URINE MICROALBUMIN  10/06/1953  . DEXA SCAN  10/06/2008  . PNA vac Low Risk Adult (1 of 2 - PCV13) 10/06/2008  . FOOT EXAM  09/22/2020  . INFLUENZA VACCINE  Discontinued  . TETANUS/TDAP  Discontinued    Physical Exam: Vitals:   10/24/19 1106  Weight: 105 lb 12.8 oz (48 kg)  Height: 5\' 3"  (1.6 m)   Body mass index is 18.74 kg/m. Physical Exam Vitals reviewed.  Constitutional:      General: She is not in acute distress.    Appearance: Normal appearance. She is not toxic-appearing.  HENT:     Head: Normocephalic and atraumatic.     Ears:     Comments: Put a piece of gauze in her ear b/c she didn't like the wind blowing in  it (it is windy out today) Eyes:     Comments: glasses  Cardiovascular:     Rate and Rhythm: Normal rate and regular rhythm.     Pulses: Normal pulses.     Heart sounds: Normal heart sounds.  Pulmonary:     Effort: Pulmonary effort is normal.     Breath sounds: Normal breath sounds.  Musculoskeletal:        General: Normal range of motion.     Comments: Wide-based gait   Skin:    General: Skin is warm and dry.  Neurological:     Mental Status: She is alert and oriented to person, place, and time.  Psychiatric:        Mood and Affect: Mood normal.     Labs reviewed: Basic Metabolic Panel: Recent Labs    07/15/19 1537  NA 142  K 3.3*  CL 104  CO2 24  GLUCOSE 91  BUN 6*  CREATININE 0.71  CALCIUM 9.2   Liver Function Tests: Recent Labs    07/15/19 1537  AST 20  ALT 13  ALKPHOS 69  BILITOT 0.8  PROT 6.6  ALBUMIN 3.7   No results for input(s): LIPASE, AMYLASE in the last 8760 hours. Recent Labs    07/15/19 1538  AMMONIA <9*   CBC: Recent Labs    07/15/19 1537  WBC 4.7  NEUTROABS 2.3  HGB 11.5*  HCT 35.2*  MCV 96.4  PLT 201   07/15/19 CT brain:   CLINICAL DATA:  Altered level of consciousness  EXAM: CT HEAD WITHOUT CONTRAST  TECHNIQUE: Contiguous axial images were obtained from the base of the skull through the vertex without intravenous contrast.  COMPARISON:  None.  FINDINGS: Brain: No findings to suggest acute hemorrhage, acute infarction or space-occupying mass lesion are seen. There are changes consistent with prior cerebellar infarcts on the right. Scattered parenchymal calcifications are noted.  Vascular: No hyperdense vessel or unexpected calcification.  Skull: Normal. Negative for fracture or focal lesion.  Sinuses/Orbits: No acute finding.  Other: None  IMPRESSION: Changes consistent with prior right cerebellar infarcts. No acute abnormality is noted.   Assessment/Plan 1. Vascular dementia without behavioral  disturbance (HCC) - not sure how much benefit we'll garner from aricept, but will try it for her after her loose stool resolves (if she's even having right now--not clear from history from her and her son today since they do not live together) - donepezil (ARICEPT) 5 MG tablet; Take 1 tablet (5 mg total) by mouth at bedtime.  Dispense: 30 tablet; Refill: 3 -encouraged her son to get in home assistance for them asap or find a retirement community for them b/c she is  scoring 17/30 and failing her clock and these demonstrate several areas of concern -I am also not convinced that she can safely provide adequate meals for herself and her husband, turn off the stove or oven, get them out in an emergency and call the right number if she needs help -suggested life alert button, as well, even if it's for her husband b/c he's apparently cognitively intact but physically disabled and she's cognitively impairment but fairly good physically -asked referral coordinator to f/u on formal home health referral I placed last visit a month ago  2. Controlled type 2 diabetes mellitus without complication, without long-term current use of insulin (HCC) -labs done today to assess this which was on her paperwork for new patient  3. History of cerebellar stroke -per CT brain in Nov last year when she had a transient altered consciousness -appears she has had other areas of her brain also affected so may have mixed dementia   45 mins spent today due to counseling re: dementia  Labs/tests ordered:  Labs already done prior to visit today Next appt:  12/26/2019  Ediel Unangst L. Merari Pion, D.O. Geriatrics [facility] [address], [place] [address] Cell Phone (Mon-Fri 8am-5pm):  [phone] On Call:  [phone] & follow prompts after 5pm & weekends Office Phone:  [phone] Office Fax:  [phone]

## 2019-10-24 NOTE — Patient Instructions (Signed)
I recommend you get assistance in the home within the next few weeks if possible.  I'm concerned about safety for you and your husband with your memory loss.

## 2019-10-25 LAB — LIPID PANEL
Cholesterol: 284 mg/dL — ABNORMAL HIGH (ref <200)
HDL: 82 mg/dL (ref >=50)
LDL Cholesterol (Calc): 184 mg/dL (calc) — ABNORMAL HIGH
Non-HDL Cholesterol (Calc): 202 mg/dL (calc) — ABNORMAL HIGH (ref <130)
Total CHOL/HDL Ratio: 3.5 (calc) (ref <5.0)
Triglycerides: 77 mg/dL (ref <150)

## 2019-10-25 LAB — HEPATIC FUNCTION PANEL
AG Ratio: 1.5 (calc) (ref 1.0–2.5)
ALT: 14 U/L (ref 6–29)
AST: 19 U/L (ref 10–35)
Albumin: 4.4 g/dL (ref 3.6–5.1)
Alkaline phosphatase (APISO): 76 U/L (ref 37–153)
Bilirubin, Direct: 0.1 mg/dL (ref 0.0–0.2)
Globulin: 2.9 g/dL (calc) (ref 1.9–3.7)
Indirect Bilirubin: 0.3 mg/dL (calc) (ref 0.2–1.2)
Total Bilirubin: 0.4 mg/dL (ref 0.2–1.2)
Total Protein: 7.3 g/dL (ref 6.1–8.1)

## 2019-10-25 LAB — BASIC METABOLIC PANEL
BUN: 15 mg/dL (ref 7–25)
CO2: 26 mmol/L (ref 20–32)
Calcium: 9.5 mg/dL (ref 8.6–10.4)
Chloride: 105 mmol/L (ref 98–110)
Creat: 0.8 mg/dL (ref 0.60–0.93)
Glucose, Bld: 89 mg/dL (ref 65–99)
Potassium: 4.1 mmol/L (ref 3.5–5.3)
Sodium: 141 mmol/L (ref 135–146)

## 2019-10-25 LAB — HEMOGLOBIN A1C
Hgb A1c MFr Bld: 5.3 % of total Hgb (ref <5.7)
Mean Plasma Glucose: 105 (calc)
eAG (mmol/L): 5.8 (calc)

## 2019-10-25 LAB — VITAMIN B12: Vitamin B-12: 318 pg/mL (ref 200–1100)

## 2019-10-25 LAB — TSH: TSH: 2.19 mIU/L (ref 0.40–4.50)

## 2019-10-25 NOTE — Progress Notes (Signed)
Please notify her son: B12 level is lower than I prefer for her age group.  I recommend she start on B12 daily (cyanocobalamin)--naturemade brand is good which is over the counter at [employer] and [employer]--goal is over 500 in her case.  Her current low level could be worsening her memory.  Thyroid was normal. Liver was normal. Sugar was normal including her sugar average (hba1c).  Electrolytes and kidneys also normal. Cholesterol was high at 184 for the bad--goal is less than 100.  I recommend some dietary changes rather than starting a pill here.  Reducing fried foods, BUTTER (switching to olive oil, coconut oil or avocado oil), sweets, red meat that tend to contribute to high cholesterol.  I also suggest she walk 10 minutes twice a day even if it's in the house as a start for exercise.

## 2019-10-31 ENCOUNTER — Encounter: Payer: Self-pay | Admitting: Internal Medicine

## 2019-10-31 DIAGNOSIS — F0150 Vascular dementia without behavioral disturbance: Secondary | ICD-10-CM

## 2019-10-31 DIAGNOSIS — Z8673 Personal history of transient ischemic attack (TIA), and cerebral infarction without residual deficits: Secondary | ICD-10-CM

## 2019-10-31 DIAGNOSIS — R413 Other amnesia: Secondary | ICD-10-CM

## 2019-12-02 ENCOUNTER — Emergency Department (HOSPITAL_COMMUNITY): Payer: Medicare Other

## 2019-12-02 ENCOUNTER — Encounter (HOSPITAL_COMMUNITY): Payer: Self-pay | Admitting: *Deleted

## 2019-12-02 ENCOUNTER — Inpatient Hospital Stay (HOSPITAL_COMMUNITY)
Admission: EM | Admit: 2019-12-02 | Discharge: 2019-12-07 | DRG: 522 | Disposition: A | Discharge status: Skilled Nursing Facility | Payer: Medicare Other | Attending: Internal Medicine | Admitting: Internal Medicine

## 2019-12-02 ENCOUNTER — Other Ambulatory Visit: Payer: Self-pay

## 2019-12-02 ENCOUNTER — Inpatient Hospital Stay (HOSPITAL_COMMUNITY): Payer: Medicare Other

## 2019-12-02 DIAGNOSIS — Z20822 Contact with and (suspected) exposure to covid-19: Secondary | ICD-10-CM | POA: Diagnosis present

## 2019-12-02 DIAGNOSIS — M80052A Age-related osteoporosis with current pathological fracture, left femur, initial encounter for fracture: Secondary | ICD-10-CM | POA: Diagnosis present

## 2019-12-02 DIAGNOSIS — Z01810 Encounter for preprocedural cardiovascular examination: Secondary | ICD-10-CM | POA: Diagnosis not present

## 2019-12-02 DIAGNOSIS — M25552 Pain in left hip: Secondary | ICD-10-CM | POA: Diagnosis present

## 2019-12-02 DIAGNOSIS — S51012A Laceration without foreign body of left elbow, initial encounter: Secondary | ICD-10-CM | POA: Diagnosis present

## 2019-12-02 DIAGNOSIS — S72002A Fracture of unspecified part of neck of left femur, initial encounter for closed fracture: Secondary | ICD-10-CM

## 2019-12-02 DIAGNOSIS — D62 Acute posthemorrhagic anemia: Secondary | ICD-10-CM | POA: Diagnosis not present

## 2019-12-02 DIAGNOSIS — S72012A Unspecified intracapsular fracture of left femur, initial encounter for closed fracture: Secondary | ICD-10-CM | POA: Diagnosis not present

## 2019-12-02 DIAGNOSIS — K5904 Chronic idiopathic constipation: Secondary | ICD-10-CM | POA: Diagnosis not present

## 2019-12-02 DIAGNOSIS — F0150 Vascular dementia without behavioral disturbance: Secondary | ICD-10-CM | POA: Diagnosis not present

## 2019-12-02 DIAGNOSIS — Z833 Family history of diabetes mellitus: Secondary | ICD-10-CM | POA: Diagnosis not present

## 2019-12-02 DIAGNOSIS — S72001A Fracture of unspecified part of neck of right femur, initial encounter for closed fracture: Secondary | ICD-10-CM | POA: Diagnosis not present

## 2019-12-02 DIAGNOSIS — W1830XA Fall on same level, unspecified, initial encounter: Secondary | ICD-10-CM | POA: Diagnosis present

## 2019-12-02 DIAGNOSIS — Z79899 Other long term (current) drug therapy: Secondary | ICD-10-CM | POA: Diagnosis not present

## 2019-12-02 DIAGNOSIS — M810 Age-related osteoporosis without current pathological fracture: Secondary | ICD-10-CM | POA: Diagnosis present

## 2019-12-02 DIAGNOSIS — Y92000 Kitchen of unspecified non-institutional (private) residence as  the place of occurrence of the external cause: Secondary | ICD-10-CM | POA: Diagnosis not present

## 2019-12-02 DIAGNOSIS — Z8673 Personal history of transient ischemic attack (TIA), and cerebral infarction without residual deficits: Secondary | ICD-10-CM

## 2019-12-02 DIAGNOSIS — E8889 Other specified metabolic disorders: Secondary | ICD-10-CM | POA: Diagnosis not present

## 2019-12-02 DIAGNOSIS — E119 Type 2 diabetes mellitus without complications: Secondary | ICD-10-CM | POA: Diagnosis not present

## 2019-12-02 DIAGNOSIS — M1990 Unspecified osteoarthritis, unspecified site: Secondary | ICD-10-CM | POA: Diagnosis not present

## 2019-12-02 HISTORY — DX: Fracture of unspecified part of neck of left femur, initial encounter for closed fracture: S72.002A

## 2019-12-02 LAB — CBC
HCT: 38.7 % (ref 36.0–46.0)
Hemoglobin: 12.3 g/dL (ref 12.0–15.0)
MCH: 30.7 pg (ref 26.0–34.0)
MCHC: 31.8 g/dL (ref 30.0–36.0)
MCV: 96.5 fL (ref 80.0–100.0)
Platelets: 208 10*3/uL (ref 150–400)
RBC: 4.01 MIL/uL (ref 3.87–5.11)
RDW: 12.8 % (ref 11.5–15.5)
WBC: 5.6 10*3/uL (ref 4.0–10.5)
nRBC: 0 % (ref 0.0–0.2)

## 2019-12-02 LAB — TYPE AND SCREEN
ABO/RH(D): O POS
Antibody Screen: NEGATIVE

## 2019-12-02 LAB — COMPREHENSIVE METABOLIC PANEL
ALT: 17 U/L (ref 0–44)
AST: 27 U/L (ref 15–41)
Albumin: 3.8 g/dL (ref 3.5–5.0)
Alkaline Phosphatase: 89 U/L (ref 38–126)
Anion gap: 10 (ref 5–15)
BUN: 15 mg/dL (ref 8–23)
CO2: 27 mmol/L (ref 22–32)
Calcium: 9 mg/dL (ref 8.9–10.3)
Chloride: 103 mmol/L (ref 98–111)
Creatinine, Ser: 0.77 mg/dL (ref 0.44–1.00)
GFR calc Af Amer: >60 mL/min (ref >60)
GFR calc non Af Amer: >60 mL/min (ref >60)
Glucose, Bld: 108 mg/dL — ABNORMAL HIGH (ref 70–99)
Potassium: 3.8 mmol/L (ref 3.5–5.1)
Sodium: 140 mmol/L (ref 135–145)
Total Bilirubin: 0.5 mg/dL (ref 0.3–1.2)
Total Protein: 6.8 g/dL (ref 6.5–8.1)

## 2019-12-02 LAB — CBG MONITORING, ED: Glucose-Capillary: 106 mg/dL — ABNORMAL HIGH (ref 70–99)

## 2019-12-02 LAB — ABO/RH: ABO/RH(D): O POS

## 2019-12-02 MED ORDER — FENTANYL CITRATE (PF) 100 MCG/2ML IJ SOLN
INTRAMUSCULAR | Status: AC
  Filled 2019-12-02: qty 2

## 2019-12-02 MED ORDER — POLYETHYLENE GLYCOL 3350 17 G PO PACK
17.0000 g | PACK | Freq: Every day | ORAL | Status: DC | PRN
  Administered 2019-12-06: 17 g via ORAL
  Filled 2019-12-02: qty 1

## 2019-12-02 MED ORDER — METHOCARBAMOL 1000 MG/10ML IJ SOLN
500.0000 mg | Freq: Four times a day (QID) | INTRAVENOUS | Status: DC | PRN
  Filled 2019-12-02: qty 5

## 2019-12-02 MED ORDER — METHOCARBAMOL 500 MG PO TABS
500.0000 mg | ORAL_TABLET | Freq: Four times a day (QID) | ORAL | Status: DC | PRN
  Administered 2019-12-03 – 2019-12-05 (×2): 500 mg via ORAL
  Filled 2019-12-02 (×2): qty 1

## 2019-12-02 MED ORDER — FENTANYL CITRATE (PF) 100 MCG/2ML IJ SOLN
50.0000 ug | Freq: Once | INTRAMUSCULAR | Status: AC
  Administered 2019-12-02: 16:00:00 50 ug via INTRAVENOUS

## 2019-12-02 MED ORDER — MORPHINE SULFATE (PF) 2 MG/ML IV SOLN
2.0000 mg | Freq: Once | INTRAVENOUS | Status: AC
  Administered 2019-12-02: 17:00:00 2 mg via INTRAVENOUS
  Filled 2019-12-02: qty 1

## 2019-12-02 MED ORDER — MORPHINE SULFATE (PF) 2 MG/ML IV SOLN
2.0000 mg | Freq: Once | INTRAVENOUS | Status: AC
  Administered 2019-12-02: 2 mg via INTRAVENOUS
  Filled 2019-12-02: qty 1

## 2019-12-02 MED ORDER — MORPHINE SULFATE (PF) 2 MG/ML IV SOLN
0.5000 mg | INTRAVENOUS | Status: DC | PRN
  Administered 2019-12-03: 04:00:00 0.5 mg via INTRAVENOUS
  Filled 2019-12-02: qty 1

## 2019-12-02 MED ORDER — HYDROCODONE-ACETAMINOPHEN 5-325 MG PO TABS
1.0000 | ORAL_TABLET | Freq: Four times a day (QID) | ORAL | Status: DC | PRN
  Administered 2019-12-02: 22:00:00 2 via ORAL
  Filled 2019-12-02: qty 2

## 2019-12-02 MED ORDER — SENNA 8.6 MG PO TABS
1.0000 | ORAL_TABLET | Freq: Two times a day (BID) | ORAL | Status: DC
  Administered 2019-12-03 – 2019-12-07 (×8): 8.6 mg via ORAL
  Filled 2019-12-02 (×8): qty 1

## 2019-12-02 NOTE — H&P (Signed)
Joyce Perry:[id number] DOB: 09-22-1945 DOA: 12/02/2019     PCP: Gayland Curry, DO   Outpatient Specialists:   NONE    Patient arrived to ER on 12/02/19 at 1546  Patient coming from: home Lives   With family    Chief Complaint:  Chief Complaint  Patient presents with  . Fall    HPI: Joyce Perry is a 74 y.o. female with medical history significant of vascular dementia, CVA, DM 2    Presented with  A unwitnessed fall at home with left hip pain, lives at home with husband who is wheelchair-bound. Fall apparently was from standing position.  Patient secondary to significant dementia unable to provide a history of why she fell.  Denies being on any blood thinners EMS was called was given fentanyl in route initial blood pressure is 130/62 heart rate 66 on    Denies any nausea vomiting no headaches no numbness Family been isolated. Son states the bottom of her feet hurt a lot due to recent podiatry procedure and have had trouble with balance due to that  No hx of ETOH Tobacco  At base line able to walk a flight of stairs or to the mail box  She has no hx of CAD  Infectious risk factors:  Unable to provide secondary to dementia  Has  Not been vaccinated against COVID    in house  PCR testing  Pending  Lab Results  Component Value Date   Edgar NEGATIVE 07/15/2019     Regarding pertinent Chronic problems:    DM 2 -  Lab Results  Component Value Date   HGBA1C 5.3 10/24/2019   diet controlled   Hx of CVA -  With  residual deficits   Dementia supposed to be on Aricept but was too expansive  While in ER: Found to have left femoral neck fracture   ER Provider Called:  orthopedics   Dr. Maretta Los   They Recommend admit to medicine   Will see in AM   Hospitalist was called for admission for left femoral neck fracture  The following Work up has been ordered so far:  Orders Placed This Encounter  Procedures  . DG Hip Unilat W or Wo Pelvis 2-3  Views Left  . DG Knee 2 Views Left  . CT Head Wo Contrast  . CT Cervical Spine Wo Contrast  . DG Femur Min 2 Views Left  . CBC  . Comprehensive metabolic panel  . Consult to orthopedic surgery  ALL PATIENTS BEING ADMITTED/HAVING PROCEDURES NEED COVID-19 SCREENING  . Consult to hospitalist  ALL PATIENTS BEING ADMITTED/HAVING PROCEDURES NEED COVID-19 SCREENING  . POC CBG, ED  . ED EKG  . EKG 12-Lead     Following Medications were ordered in ER: Medications  fentaNYL (SUBLIMAZE) injection 50 mcg (50 mcg Intravenous Given 12/02/19 1606)  morphine 2 MG/ML injection 2 mg (2 mg Intravenous Given 12/02/19 1725)  morphine 2 MG/ML injection 2 mg (2 mg Intravenous Given 12/02/19 2004)        Consult Orders  (From admission, onward)         Start     Ordered   12/02/19 1918  Consult to hospitalist  ALL PATIENTS BEING ADMITTED/HAVING PROCEDURES NEED COVID-19 SCREENING  Once    Comments: ALL PATIENTS BEING ADMITTED/HAVING PROCEDURES NEED COVID-19 SCREENING  Provider:  (Not yet assigned)  Question Answer Comment  Place call to: [facility]   Reason for Consult Admit  12/02/19 1917           Significant initial  Findings: Abnormal Labs Reviewed  COMPREHENSIVE METABOLIC PANEL - Abnormal; Notable for the following components:      Result Value   Glucose, Bld 108 (*)    All other components within normal limits  CBG MONITORING, ED - Abnormal; Notable for the following components:   Glucose-Capillary 106 (*)    All other components within normal limits     Otherwise labs showing:    Recent Labs  Lab 12/02/19 1601  NA 140  K 3.8  CO2 27  GLUCOSE 108*  BUN 15  CREATININE 0.77  CALCIUM 9.0    Cr   stable,   Lab Results  Component Value Date   CREATININE 0.77 12/02/2019   CREATININE 0.80 10/24/2019   CREATININE 0.71 07/15/2019    Recent Labs  Lab 12/02/19 1601  AST 27  ALT 17  ALKPHOS 89  BILITOT 0.5  PROT 6.8  ALBUMIN 3.8   Lab Results  Component  Value Date   CALCIUM 9.0 12/02/2019      WBC     Component Value Date/Time   WBC 5.6 12/02/2019 1601   ANC    Component Value Date/Time   NEUTROABS 2.3 07/15/2019 1537   ALC No components found for: LYMPHAB    Plt: Lab Results  Component Value Date   PLT 208 12/02/2019     COVID-19 Labs  No results for input(s): DDIMER, FERRITIN, LDH, CRP in the last 72 hours.  Lab Results  Component Value Date   SARSCOV2NAA NEGATIVE 07/15/2019     HG/HCT   Stable,     Component Value Date/Time   HGB 12.3 12/02/2019 1601   HCT 38.7 12/02/2019 1601      ECG: Ordered Personally reviewed by me showing: HR : 55 Rhythm: NSR,   no evidence of ischemic changes QTC 415   DM  labs:  HbA1C: Recent Labs    10/24/19 1053  HGBA1C 5.3       CBG (last 3)  Recent Labs    12/02/19 1620  GLUCAP 106*       UA  not ordered     Ordered  CT HEAD   NON acute  CXR -  NON acute   left femur - femoral neck fracture  ED Triage Vitals  Enc Vitals Group     BP 12/02/19 1613 (!) 134/92     Pulse Rate 12/02/19 1613 69     Resp 12/02/19 1613 18     Temp 12/02/19 1613 98 F (36.7 C)     Temp Source 12/02/19 1613 Oral     SpO2 12/02/19 1613 99 %     Weight 12/02/19 1607 105 lb 13.1 oz (48 kg)     Height 12/02/19 1607 5\' 3"  (1.6 m)     Head Circumference --      Peak Flow --      Pain Score 12/02/19 1607 9     Pain Loc --      Pain Edu? --      Excl. in GC? --   TMAX(24)@       Latest  Blood pressure (!) 151/65, pulse 65, temperature 98 F (36.7 C), temperature source Oral, resp. rate 18, height 5\' 3"  (1.6 m), weight 48 kg, SpO2 98 %.      Review of Systems:    Pertinent positives include: left hip pain   Constitutional:  No weight loss, night  sweats, Fevers, chills, fatigue, weight loss  HEENT:  No headaches, Difficulty swallowing,Tooth/dental problems,Sore throat,  No sneezing, itching, ear ache, nasal congestion, post nasal drip,  Cardio-vascular:  No chest  pain, Orthopnea, PND, anasarca, dizziness, palpitations.no Bilateral lower extremity swelling  GI:  No heartburn, indigestion, abdominal pain, nausea, vomiting, diarrhea, change in bowel habits, loss of appetite, melena, blood in stool, hematemesis Resp:  no shortness of breath at rest. No dyspnea on exertion, No excess mucus, no productive cough, No non-productive cough, No coughing up of blood.No change in color of mucus.No wheezing. Skin:  no rash or lesions. No jaundice GU:  no dysuria, change in color of urine, no urgency or frequency. No straining to urinate.  No flank pain.  Musculoskeletal:  No joint pain or no joint swelling. No decreased range of motion. No back pain.  Psych:  No change in mood or affect. No depression or anxiety. No memory loss.  Neuro: no localizing neurological complaints, no tingling, no weakness, no double vision, no gait abnormality, no slurred speech, no confusion  All systems reviewed and apart from HOPI all are negative  Past Medical History:   Past Medical History:  Diagnosis Date  . Arthritis   . Confusion   . Diabetes (HCC)   . Memory loss   . Nervousness     History reviewed. No pertinent surgical history.  Social History:  Ambulatory   Independently     reports that she has never smoked. She has never used smokeless tobacco. She reports that she does not drink alcohol or use drugs.     Family History:   Family History  Problem Relation Age of Onset  . Diabetes Other     Allergies: No Known Allergies   Prior to Admission medications   Medication Sig Start Date End Date Taking? Authorizing Provider  Cyanocobalamin (VITAMIN B 12 PO) Take 100 tablets by mouth daily.   Yes [provider]  donepezil (ARICEPT) 5 MG tablet Take 1 tablet (5 mg total) by mouth at bedtime. Patient not taking: Reported on 12/02/2019 10/24/19   Kermit Balo, DO   Physical Exam: Blood pressure (!) 151/65, pulse 65, temperature 98 F (36.7  C), temperature source Oral, resp. rate 18, height 5\' 3"  (1.6 m), weight 48 kg, SpO2 98 %. 1. General:  in No Acute distress    Chronically ill  -appearing 2. Psychological: Alert and   Oriented to self 3. Head/ENT:     Dry Mucous Membranes                          Head Non traumatic, neck supple                           Poor Dentition 4. SKIN:  decreased Skin turgor,  Skin clean Dry and intact no rash 5. Heart: Regular rate and rhythm no  Murmur, no Rub or gallop 6. Lungs: no wheezes or crackles   7. Abdomen: Soft,  non-tender, Non distended  bowel sounds present 8. Lower extremities: no clubbing, cyanosis, no edema, left leg rotated 9. Neurologically Grossly intact, moving all 4 extremities equally  10. MSK: Normal range of motion limited by pain on the left leg   All other LABS:     Recent Labs  Lab 12/02/19 1601  WBC 5.6  HGB 12.3  HCT 38.7  MCV 96.5  PLT 208     Recent  Labs  Lab 12/02/19 1601  NA 140  K 3.8  CL 103  CO2 27  GLUCOSE 108*  BUN 15  CREATININE 0.77  CALCIUM 9.0     Recent Labs  Lab 12/02/19 1601  AST 27  ALT 17  ALKPHOS 89  BILITOT 0.5  PROT 6.8  ALBUMIN 3.8       Cultures: No results found for: SDES, SPECREQUEST, CULT, REPTSTATUS   Radiological Exams on Admission: DG Knee 2 Views Left  Result Date: 12/02/2019 CLINICAL DATA:  Larey Seat from standing position, LEFT femur pain EXAM: LEFT KNEE - 1-2 VIEW COMPARISON:  None FINDINGS: Osseous demineralization. Joint spaces preserved. No acute fracture, dislocation, or bone destruction. No joint effusion. IMPRESSION: No acute osseous abnormalities. Electronically Signed   By: Ulyses Southward M.D.   On: 12/02/2019 17:44   CT Head Wo Contrast  Result Date: 12/02/2019 CLINICAL DATA:  Acute pain due to trauma. EXAM: CT HEAD WITHOUT CONTRAST CT CERVICAL SPINE WITHOUT CONTRAST TECHNIQUE: Multidetector CT imaging of the head and cervical spine was performed following the standard protocol without intravenous  contrast. Multiplanar CT image reconstructions of the cervical spine were also generated. COMPARISON:  CT head dated July 15, 2019 FINDINGS: CT HEAD FINDINGS Brain: No evidence of acute infarction, hemorrhage, hydrocephalus, extra-axial collection or mass lesion/mass effect. Encephalomalacia is noted involving the right cerebellum. Vascular: No hyperdense vessel or unexpected calcification. Skull: Normal. Negative for fracture or focal lesion. Sinuses/Orbits: No acute finding. Other: None. CT CERVICAL SPINE FINDINGS Alignment: Normal. Skull base and vertebrae: No acute fracture. No primary bone lesion or focal pathologic process. Soft tissues and spinal canal: No prevertebral fluid or swelling. No visible canal hematoma. Disc levels: Mild multilevel disc height loss is noted throughout the cervical spine, greatest at the C4-C5 level. Upper chest: Negative. Other: None IMPRESSION: 1. No acute intracranial abnormality. 2. No acute cervical spine fracture. Electronically Signed   By: Katherine Mantle M.D.   On: 12/02/2019 19:07   CT Cervical Spine Wo Contrast  Result Date: 12/02/2019 CLINICAL DATA:  Acute pain due to trauma. EXAM: CT HEAD WITHOUT CONTRAST CT CERVICAL SPINE WITHOUT CONTRAST TECHNIQUE: Multidetector CT imaging of the head and cervical spine was performed following the standard protocol without intravenous contrast. Multiplanar CT image reconstructions of the cervical spine were also generated. COMPARISON:  CT head dated July 15, 2019 FINDINGS: CT HEAD FINDINGS Brain: No evidence of acute infarction, hemorrhage, hydrocephalus, extra-axial collection or mass lesion/mass effect. Encephalomalacia is noted involving the right cerebellum. Vascular: No hyperdense vessel or unexpected calcification. Skull: Normal. Negative for fracture or focal lesion. Sinuses/Orbits: No acute finding. Other: None. CT CERVICAL SPINE FINDINGS Alignment: Normal. Skull base and vertebrae: No acute fracture. No primary  bone lesion or focal pathologic process. Soft tissues and spinal canal: No prevertebral fluid or swelling. No visible canal hematoma. Disc levels: Mild multilevel disc height loss is noted throughout the cervical spine, greatest at the C4-C5 level. Upper chest: Negative. Other: None IMPRESSION: 1. No acute intracranial abnormality. 2. No acute cervical spine fracture. Electronically Signed   By: Katherine Mantle M.D.   On: 12/02/2019 19:07   DG Hip Unilat W or Wo Pelvis 2-3 Views Left  Result Date: 12/02/2019 CLINICAL DATA:  Larey Seat from standing position, pain at LEFT femur EXAM: DG HIP (WITH OR WITHOUT PELVIS) 2-3V LEFT COMPARISON:  None FINDINGS: Osseous demineralization. Hip and SI joint spaces preserved. Displaced subcapital fracture LEFT femoral neck. No dislocation. Visualized pelvis intact. Degenerative disc and facet disease  changes at visualized lower lumbar spine. IMPRESSION: Displaced subcapital fracture of the LEFT femoral neck. Electronically Signed   By: Ulyses Southward M.D.   On: 12/02/2019 17:43   DG Femur Min 2 Views Left  Result Date: 12/02/2019 CLINICAL DATA:  Fall. Known femoral neck fracture. Tenderness over the mid femur. EXAM: LEFT FEMUR 2 VIEWS COMPARISON:  Hip and knee series earlier today FINDINGS: Left femoral neck fracture again noted with varus angulation. No subluxation or dislocation. No additional acute femoral abnormality. No joint effusion within the left knee. IMPRESSION: Left femoral neck fracture with varus angulation. Electronically Signed   By: Charlett Nose M.D.   On: 12/02/2019 19:56    Chart has been reviewed    Assessment/Plan  74 y.o. female with medical history significant of dementia, CVA, DM 2 Admitted for  left femoral neck fracture  Present on Admission:  . Femoral neck fracture (HCC) -    management as per orthopedics,  plan to operate some time tomorrow    Keep nothing by mouth post midnight. Patient  not on anticoagulation or antiplatelet agents    Ordered type and screen, order a vitamin D level  Patient at baseline able to walk a flight of stairs or 100 feet    Patient denies any chest pain or shortness of breath currently and/or with exertion,   ECG showing no evidence of acute ischemia  no known history of coronary artery disease,  COPD Liver failure   CKD  Given advanced age patient is at least moderate  risk  which has been discussed with family but at this point no furthther cardiac workup is indicated.    . Vascular dementia (HCC) -anticipate sundowning while hospitalized, treated as needed would most likely benefit from placement, per family Aricept was never started secondary to financial reasons.   Will hold off for now  DM2-  - Order Sensitive  SSI   -  check TSH and HgA1C  diet controlled at baseline very mild  . Chronic idiopathic constipation - stable  Other plan as per orders.  DVT prophylaxis:  SCD    Code Status:  FULL CODE  as per family  I had personally discussed CODE STATUS with  family     Family Communication:   Family not at  Bedside  plan of care was discussed on the phone with  Son  Disposition Plan:      likely will need placement for rehabilitation                          Following barriers for discharge:                                   Pain controlled with PO medications                                                            Will need to be able to tolerate PO                            Will likely need home health, versus placement  Will need consultants to evaluate patient prior to discharge                     Would benefit from PT/OT eval prior to DC will defer to orthopedics                                    Transition of care consulted                     Consults called: Orthopedics aware  Admission status:  ED Disposition    None       inpatient     I Expect 2 midnight stay secondary to severity of patient's current illness need for  inpatient interventions justified by the following:     Severe lab/radiological/exam abnormalities including: Left femoral neck fracture   and extensive comorbidities including:  DM2    dementia .    That are currently affecting medical management.   I expect  patient to be hospitalized for 2 midnights requiring inpatient medical care.  Patient is at high risk for adverse outcome (such as loss of life or disability) if not treated.  Indication for inpatient stay as follows:   Need for  Iv  pain medications and operative intervention     Level of care      medical floor   Precautions: admitted as  asymptomatic screening protocol    PPE: Used by the provider:   P100  eye Goggles,  Gloves      Shmuel Girgis 12/02/2019, 9:08 PM    [facility]     after 2 AM please page floor coverage PA If 7AM-7PM, please contact the day team taking care of the patient using [email or web address]   Patient was evaluated in the context of the global COVID-19 pandemic, which necessitated consideration that the patient might be at risk for infection with the SARS-CoV-2 virus that causes COVID-19. Institutional protocols and algorithms that pertain to the evaluation of patients at risk for COVID-19 are in a state of rapid change based on information released by regulatory bodies including the CDC and federal and state organizations. These policies and algorithms were followed during the patient's care.

## 2019-12-02 NOTE — ED Triage Notes (Addendum)
Pt here via [facility] from fall from standing position.  Denies loc.  Unknown reason as to why she fell.  Pain to L thigh and skin tear to L elbow.  No obvious deformity to L thigh, but pt unable to extend leg.  Pt is not on blood thinners.  Given 50 mcg fentanyl en-route which did decrease pain.  138/62 66 hr 16 rr 97% ra

## 2019-12-02 NOTE — ED Notes (Signed)
CBG Results of 106 reported to [place], Charity fundraiser.

## 2019-12-02 NOTE — ED Provider Notes (Signed)
[facility] EMERGENCY DEPARTMENT Provider Note   CSN: [id number] Arrival date & time: 12/02/19  1546     History Chief Complaint  Patient presents with  . Fall    Joyce Perry is a 74 y.o. female with PMH significant for vascular dementia, type II DM without complication, and cerebellar stroke who presents to the ED via EMS after sustaining an unwitnessed ground level fall in her kitchen.  Patient lives at home with her husband who was outside on the deck when she fell.  EMS had to assist her onto a stretcher and she was provided fentanyl in route to the hospital.  Patient complains of significant discomfort and this point towards her left thigh.  She also has a mild skin tear to left elbow that has been dressed by EMS.  On my examination, she does not appear to be altered or confused.  However, she cannot remember how she fell and whether or not it was mechanical or a syncopal episode.  She rates her pain as 10 out of 10, particularly with any form of active or passive movement of left leg.  She denies any headache, head injury, neck pain, back pain, chest pain or difficulty breathing, abdominal pain, nausea or vomiting, numbness, or other symptoms.  She is not on blood thinners.  Patient states that she was having a normal day and had eaten leftover chicken earlier, however her memory was strained and she was having difficulty recollecting the events that preceded her fall.  HPI     Past Medical History:  Diagnosis Date  . Arthritis   . Confusion   . Diabetes (HCC)   . Memory loss   . Nervousness     Patient Active Problem List   Diagnosis Date Noted  . Vascular dementia without behavioral disturbance (HCC) 10/24/2019  . Chronic idiopathic constipation 09/23/2019  . History of cerebellar stroke 09/23/2019  . Memory loss 09/23/2019  . Controlled type 2 diabetes mellitus without complication, without long-term current use of insulin (HCC) 09/23/2019  . Foot  pain, bilateral 09/23/2019    History reviewed. No pertinent surgical history.   OB History   No obstetric history on file.     No family history on file.  Social History   Tobacco Use  . Smoking status: Never Smoker  . Smokeless tobacco: Never Used  Substance Use Topics  . Alcohol use: Never  . Drug use: Never    Home Medications Prior to Admission medications   Medication Sig Start Date End Date Taking? Authorizing Provider  donepezil (ARICEPT) 5 MG tablet Take 1 tablet (5 mg total) by mouth at bedtime. 10/24/19   Reed, Tiffany L, DO    Allergies    Patient has no known allergies.  Review of Systems   Review of Systems  Respiratory: Negative for shortness of breath.   Cardiovascular: Negative for chest pain.  Musculoskeletal: Positive for gait problem and neck pain. Negative for back pain and neck stiffness.  Skin: Positive for wound.  Neurological: Negative for weakness and numbness.    Physical Exam Updated Vital Signs BP (!) 151/65   Pulse 65   Temp 98 F (36.7 C) (Oral)   Resp 18   Ht 5\' 3"  (1.6 m)   Wt 48 kg   SpO2 98%   BMI 18.75 kg/m   Physical Exam Vitals and nursing note reviewed. Exam conducted with a chaperone present.  Constitutional:      Comments: Uncomfortable.  HENT:  Head: Normocephalic and atraumatic.     Comments: No obvious trauma.  No palpable skull defects.  No bleeding or other overlying skin changes.    Nose: Nose normal.     Mouth/Throat:     Pharynx: Oropharynx is clear.  Eyes:     General: No scleral icterus.    Conjunctiva/sclera: Conjunctivae normal.  Neck:     Comments: No midline cervical tenderness. Cardiovascular:     Rate and Rhythm: Normal rate and regular rhythm.     Pulses: Normal pulses.     Heart sounds: Normal heart sounds.  Pulmonary:     Effort: Pulmonary effort is normal. No respiratory distress.     Breath sounds: Normal breath sounds.  Abdominal:     General: There is no distension.      Palpations: Abdomen is soft.     Tenderness: There is no abdominal tenderness. There is no guarding.  Musculoskeletal:     Cervical back: Normal range of motion and neck supple. No rigidity.     Comments: Left leg: Patient unwilling to flex or extend at hip or knee due to pain elicited over lateral aspect of thigh.  Patient can flex and dorsiflex distally at ankle.  Sensation intact.  Pedal pulse intact.  No ecchymoses, wound, or other overlying skin changes.  No significant swelling.  Soft compartments. Left elbow: Skin tear well dressed over proximal ulna.  Bleeding controlled.  No bony TTP.  ROM and strength intact.  Radial pulse intact.  Cap refill intact.  Sensation intact.  Soft compartments.  Skin:    General: Skin is dry.     Capillary Refill: Capillary refill takes less than 2 seconds.  Neurological:     Mental Status: She is alert and oriented to person, place, and time.     GCS: GCS eye subscore is 4. GCS verbal subscore is 5. GCS motor subscore is 6.  Psychiatric:        Mood and Affect: Mood normal.        Behavior: Behavior normal.     Comments: Strained memory.      ED Results / Procedures / Treatments   Labs (all labs ordered are listed, but only abnormal results are displayed) Labs Reviewed  COMPREHENSIVE METABOLIC PANEL - Abnormal; Notable for the following components:      Result Value   Glucose, Bld 108 (*)    All other components within normal limits  CBG MONITORING, ED - Abnormal; Notable for the following components:   Glucose-Capillary 106 (*)    All other components within normal limits  CBC    EKG EKG Interpretation  Date/Time:  Monday December 02 2019 16:18:50 EDT Ventricular Rate:  55 PR Interval:  130 QRS Duration: 78 QT Interval:  434 QTC Calculation: 415 R Axis:   53 Text Interpretation: Sinus bradycardia Otherwise normal ECG No STEMI Confirmed by Alona Bene [phone]) on 12/02/2019 4:48:59 PM   Radiology DG Knee 2 Views Left  Result Date:  12/02/2019 CLINICAL DATA:  Larey Seat from standing position, LEFT femur pain EXAM: LEFT KNEE - 1-2 VIEW COMPARISON:  None FINDINGS: Osseous demineralization. Joint spaces preserved. No acute fracture, dislocation, or bone destruction. No joint effusion. IMPRESSION: No acute osseous abnormalities. Electronically Signed   By: Ulyses Southward M.D.   On: 12/02/2019 17:44   DG Hip Unilat W or Wo Pelvis 2-3 Views Left  Result Date: 12/02/2019 CLINICAL DATA:  Larey Seat from standing position, pain at LEFT femur EXAM: DG HIP (WITH OR  WITHOUT PELVIS) 2-3V LEFT COMPARISON:  None FINDINGS: Osseous demineralization. Hip and SI joint spaces preserved. Displaced subcapital fracture LEFT femoral neck. No dislocation. Visualized pelvis intact. Degenerative disc and facet disease changes at visualized lower lumbar spine. IMPRESSION: Displaced subcapital fracture of the LEFT femoral neck. Electronically Signed   By: Lavonia Dana M.D.   On: 12/02/2019 17:43    Procedures Procedures (including critical care time)  Medications Ordered in ED Medications  fentaNYL (SUBLIMAZE) injection 50 mcg (50 mcg Intravenous Given 12/02/19 1606)  morphine 2 MG/ML injection 2 mg (2 mg Intravenous Given 12/02/19 1725)    ED Course  I have reviewed the triage vital signs and the nursing notes.  Pertinent labs & imaging results that were available during my care of the patient were reviewed by me and considered in my medical decision making (see chart for details).    MDM Rules/Calculators/A&P                      I reviewed patient's medical record and she was evaluated on 10/24/2019 by Dr. Mariea Clonts for her vascular dementia and was encouraged to get in-home assistance or find a retirement community given her 23 out of 30 dementia scoring.  She was also suggested to obtain a life alert button.  Her husband is cognitively intact however physically disabled and wheelchair-bound.   I spoke with her son Joyce Perry who reports that his father had to flag  down the gentleman who was mowing the lawn to assist him with the patient and call EMS.  They have plans to move to an apartment on 12/18/2019 to live next to the son.  The father receives regular care from [facility] and he is cognitively intact.  The son also reports that they are in discussions with the [facility] in [place], [place] for long-term placement.  According to the son, her strained memory to recall what she did today prior to her fall is baseline for her.  I reviewed plain films obtained of patient's left knee and left hip which demonstrated displaced subcapital fracture of left femoral neck.  No dislocation.  Pelvis intact.  Will consult with orthopedics.  Will still obtain plain films of femur given midshaft tenderness on exam that is not captured on plain films.  Spoke with Dr. Percell Miller, orthopedics.  He is recommending admission to medicine and asked that patient be kept n.p.o. after midnight.  He will evaluate her in the morning for surgery.  Will consult medicine.   Spoke with Dr. Roel Cluck who will see and admit patient.   Final Clinical Impression(s) / ED Diagnoses Final diagnoses:  Closed displaced fracture of right femoral neck [facility])    Rx / DC Orders ED Discharge Orders    None       Corena Herter, PA-C 12/02/19 2022    Margette Fast, MD 12/03/19 1257

## 2019-12-02 NOTE — ED Notes (Signed)
Norco given per [facility]. Name/DOB verified with pt. CXR completed at bedside Type and screen drawn, labeled with 2 pt identifiers, and sent

## 2019-12-02 NOTE — ED Notes (Signed)
Care endorsed to [facility], [place]

## 2019-12-02 NOTE — ED Notes (Signed)
Pt returned from XRAY, nad noted

## 2019-12-02 NOTE — ED Notes (Signed)
covid swab collected, labeled with 2 pt identifiers, and brought to lab 

## 2019-12-03 ENCOUNTER — Encounter (HOSPITAL_COMMUNITY): Payer: Self-pay | Admitting: Internal Medicine

## 2019-12-03 ENCOUNTER — Inpatient Hospital Stay (HOSPITAL_COMMUNITY): Payer: Medicare Other | Admitting: Anesthesiology

## 2019-12-03 ENCOUNTER — Encounter (HOSPITAL_COMMUNITY)
Admission: EM | Disposition: A | Discharge status: Skilled Nursing Facility | Payer: Self-pay | Source: Home / Self Care | Attending: Internal Medicine

## 2019-12-03 ENCOUNTER — Inpatient Hospital Stay (HOSPITAL_COMMUNITY): Payer: Medicare Other

## 2019-12-03 ENCOUNTER — Other Ambulatory Visit: Payer: Self-pay

## 2019-12-03 DIAGNOSIS — M80052A Age-related osteoporosis with current pathological fracture, left femur, initial encounter for fracture: Secondary | ICD-10-CM

## 2019-12-03 HISTORY — DX: Age-related osteoporosis with current pathological fracture, left femur, initial encounter for fracture: M80.052A

## 2019-12-03 HISTORY — PX: HIP ARTHROPLASTY: SHX981

## 2019-12-03 LAB — BASIC METABOLIC PANEL
Anion gap: 9 (ref 5–15)
BUN: 14 mg/dL (ref 8–23)
CO2: 24 mmol/L (ref 22–32)
Calcium: 8.8 mg/dL — ABNORMAL LOW (ref 8.9–10.3)
Chloride: 102 mmol/L (ref 98–111)
Creatinine, Ser: 0.65 mg/dL (ref 0.44–1.00)
GFR calc Af Amer: >60 mL/min (ref >60)
GFR calc non Af Amer: >60 mL/min (ref >60)
Glucose, Bld: 111 mg/dL — ABNORMAL HIGH (ref 70–99)
Potassium: 3.9 mmol/L (ref 3.5–5.1)
Sodium: 135 mmol/L (ref 135–145)

## 2019-12-03 LAB — CBC
HCT: 37.4 % (ref 36.0–46.0)
Hemoglobin: 12.2 g/dL (ref 12.0–15.0)
MCH: 30.8 pg (ref 26.0–34.0)
MCHC: 32.6 g/dL (ref 30.0–36.0)
MCV: 94.4 fL (ref 80.0–100.0)
Platelets: 199 10*3/uL (ref 150–400)
RBC: 3.96 MIL/uL (ref 3.87–5.11)
RDW: 12.7 % (ref 11.5–15.5)
WBC: 6.3 10*3/uL (ref 4.0–10.5)
nRBC: 0 % (ref 0.0–0.2)

## 2019-12-03 LAB — SURGICAL PCR SCREEN
MRSA, PCR: NEGATIVE
Staphylococcus aureus: POSITIVE — AB

## 2019-12-03 LAB — VITAMIN D 25 HYDROXY (VIT D DEFICIENCY, FRACTURES): Vit D, 25-Hydroxy: 73.75 ng/mL (ref 30–100)

## 2019-12-03 LAB — SARS CORONAVIRUS 2 (TAT 6-24 HRS): SARS Coronavirus 2: NEGATIVE

## 2019-12-03 LAB — GLUCOSE, CAPILLARY
Glucose-Capillary: 94 mg/dL (ref 70–99)
Glucose-Capillary: 101 mg/dL — ABNORMAL HIGH (ref 70–99)
Glucose-Capillary: 116 mg/dL — ABNORMAL HIGH (ref 70–99)

## 2019-12-03 SURGERY — HEMIARTHROPLASTY, HIP, DIRECT ANTERIOR APPROACH, FOR FRACTURE
Anesthesia: General | Site: Hip | Laterality: Left

## 2019-12-03 MED ORDER — PROPOFOL 10 MG/ML IV BOLUS
INTRAVENOUS | Status: DC | PRN
  Administered 2019-12-03: 130 mg via INTRAVENOUS

## 2019-12-03 MED ORDER — METOCLOPRAMIDE HCL 5 MG PO TABS: 5.0000 mg | ORAL_TABLET | Freq: Three times a day (TID) | ORAL | Status: DC | PRN

## 2019-12-03 MED ORDER — ONDANSETRON HCL 4 MG/2ML IJ SOLN
INTRAMUSCULAR | Status: DC | PRN
  Administered 2019-12-03: 4 mg via INTRAVENOUS

## 2019-12-03 MED ORDER — STERILE WATER FOR IRRIGATION IR SOLN
Status: DC | PRN
  Administered 2019-12-03: 1000 mL

## 2019-12-03 MED ORDER — FENTANYL CITRATE (PF) 100 MCG/2ML IJ SOLN
50.0000 ug | Freq: Once | INTRAMUSCULAR | Status: AC
  Filled 2019-12-03: qty 1

## 2019-12-03 MED ORDER — PHENYLEPHRINE HCL-NACL 10-0.9 MG/250ML-% IV SOLN
INTRAVENOUS | Status: DC | PRN
  Administered 2019-12-03: 25 ug/min via INTRAVENOUS

## 2019-12-03 MED ORDER — CEFAZOLIN SODIUM-DEXTROSE 2-4 GM/100ML-% IV SOLN
2.0000 g | Freq: Four times a day (QID) | INTRAVENOUS | Status: AC
  Administered 2019-12-03 – 2019-12-04 (×2): 2 g via INTRAVENOUS
  Filled 2019-12-03 (×2): qty 100

## 2019-12-03 MED ORDER — ONDANSETRON HCL 4 MG/2ML IJ SOLN: 4.0000 mg | Freq: Once | INTRAMUSCULAR | Status: DC | PRN

## 2019-12-03 MED ORDER — ONDANSETRON HCL 4 MG/2ML IJ SOLN
INTRAMUSCULAR | Status: AC
  Filled 2019-12-03: qty 2

## 2019-12-03 MED ORDER — PROPOFOL 10 MG/ML IV BOLUS
INTRAVENOUS | Status: AC
  Filled 2019-12-03: qty 20

## 2019-12-03 MED ORDER — SODIUM CHLORIDE 0.9 % IR SOLN
Status: DC | PRN
  Administered 2019-12-03: 3000 mL

## 2019-12-03 MED ORDER — GLYCOPYRROLATE PF 0.2 MG/ML IJ SOSY
PREFILLED_SYRINGE | INTRAMUSCULAR | Status: DC | PRN
  Administered 2019-12-03: .2 mg via INTRAVENOUS

## 2019-12-03 MED ORDER — GLYCOPYRROLATE PF 0.2 MG/ML IJ SOSY
PREFILLED_SYRINGE | INTRAMUSCULAR | Status: AC
  Filled 2019-12-03: qty 1

## 2019-12-03 MED ORDER — ONDANSETRON HCL 4 MG PO TABS: 4.0000 mg | ORAL_TABLET | Freq: Four times a day (QID) | ORAL | Status: DC | PRN

## 2019-12-03 MED ORDER — SUGAMMADEX SODIUM 200 MG/2ML IV SOLN
INTRAVENOUS | Status: DC | PRN
  Administered 2019-12-03: 180 mg via INTRAVENOUS

## 2019-12-03 MED ORDER — FENTANYL CITRATE (PF) 250 MCG/5ML IJ SOLN
INTRAMUSCULAR | Status: AC
  Filled 2019-12-03: qty 5

## 2019-12-03 MED ORDER — FENTANYL CITRATE (PF) 100 MCG/2ML IJ SOLN
INTRAMUSCULAR | Status: DC | PRN
  Administered 2019-12-03: 50 ug via INTRAVENOUS
  Administered 2019-12-03: 25 ug via INTRAVENOUS
  Administered 2019-12-03: 50 ug via INTRAVENOUS
  Administered 2019-12-03: 25 ug via INTRAVENOUS
  Administered 2019-12-03: 50 ug via INTRAVENOUS

## 2019-12-03 MED ORDER — ONDANSETRON HCL 4 MG/2ML IJ SOLN: 4.0000 mg | Freq: Four times a day (QID) | INTRAMUSCULAR | Status: DC | PRN

## 2019-12-03 MED ORDER — METOCLOPRAMIDE HCL 5 MG/ML IJ SOLN: 5.0000 mg | Freq: Three times a day (TID) | INTRAMUSCULAR | Status: DC | PRN

## 2019-12-03 MED ORDER — PHENOL 1.4 % MT LIQD: 1.0000 | OROMUCOSAL | Status: DC | PRN

## 2019-12-03 MED ORDER — FENTANYL CITRATE (PF) 100 MCG/2ML IJ SOLN: 25.0000 ug | INTRAMUSCULAR | Status: DC | PRN

## 2019-12-03 MED ORDER — CEFAZOLIN SODIUM-DEXTROSE 2-4 GM/100ML-% IV SOLN
2.0000 g | Freq: Once | INTRAVENOUS | Status: AC
  Administered 2019-12-03: 2 g via INTRAVENOUS
  Filled 2019-12-03: qty 100

## 2019-12-03 MED ORDER — LACTATED RINGERS IV SOLN: INTRAVENOUS | Status: DC | PRN

## 2019-12-03 MED ORDER — HYDROCODONE-ACETAMINOPHEN 5-325 MG PO TABS
1.0000 | ORAL_TABLET | ORAL | Status: DC | PRN
  Administered 2019-12-05: 1 via ORAL
  Filled 2019-12-03: qty 1

## 2019-12-03 MED ORDER — LACTATED RINGERS IV SOLN: INTRAVENOUS | Status: DC

## 2019-12-03 MED ORDER — DEXAMETHASONE SODIUM PHOSPHATE 10 MG/ML IJ SOLN
INTRAMUSCULAR | Status: DC | PRN
  Administered 2019-12-03: 8 mg via INTRAVENOUS

## 2019-12-03 MED ORDER — CHLORHEXIDINE GLUCONATE CLOTH 2 % EX PADS
6.0000 | MEDICATED_PAD | Freq: Every day | CUTANEOUS | Status: DC
  Administered 2019-12-03 – 2019-12-07 (×4): 6 via TOPICAL

## 2019-12-03 MED ORDER — OXYCODONE HCL 5 MG/5ML PO SOLN: 5.0000 mg | Freq: Once | ORAL | Status: DC | PRN

## 2019-12-03 MED ORDER — LIDOCAINE 2% (20 MG/ML) 5 ML SYRINGE
INTRAMUSCULAR | Status: DC | PRN
  Administered 2019-12-03: 30 mg via INTRAVENOUS

## 2019-12-03 MED ORDER — EPHEDRINE 5 MG/ML INJ
INTRAVENOUS | Status: AC
  Filled 2019-12-03: qty 20

## 2019-12-03 MED ORDER — DOCUSATE SODIUM 100 MG PO CAPS
100.0000 mg | ORAL_CAPSULE | Freq: Two times a day (BID) | ORAL | Status: DC
  Administered 2019-12-03 – 2019-12-07 (×8): 100 mg via ORAL
  Filled 2019-12-03 (×8): qty 1

## 2019-12-03 MED ORDER — ACETAMINOPHEN 325 MG PO TABS
325.0000 mg | ORAL_TABLET | Freq: Four times a day (QID) | ORAL | Status: DC | PRN
  Administered 2019-12-05 – 2019-12-06 (×2): 650 mg via ORAL
  Filled 2019-12-03 (×2): qty 2

## 2019-12-03 MED ORDER — 0.9 % SODIUM CHLORIDE (POUR BTL) OPTIME
TOPICAL | Status: DC | PRN
  Administered 2019-12-03: 13:00:00 1000 mL

## 2019-12-03 MED ORDER — MENTHOL 3 MG MT LOZG: 1.0000 | LOZENGE | OROMUCOSAL | Status: DC | PRN

## 2019-12-03 MED ORDER — EPHEDRINE SULFATE 50 MG/ML IJ SOLN
INTRAMUSCULAR | Status: DC | PRN
  Administered 2019-12-03: 5 mg via INTRAVENOUS

## 2019-12-03 MED ORDER — ACETAMINOPHEN 500 MG PO TABS
500.0000 mg | ORAL_TABLET | Freq: Three times a day (TID) | ORAL | Status: DC
  Administered 2019-12-03 – 2019-12-07 (×10): 500 mg via ORAL
  Filled 2019-12-03 (×10): qty 1

## 2019-12-03 MED ORDER — OXYCODONE HCL 5 MG PO TABS: 5.0000 mg | ORAL_TABLET | Freq: Once | ORAL | Status: DC | PRN

## 2019-12-03 MED ORDER — FENTANYL CITRATE (PF) 100 MCG/2ML IJ SOLN
INTRAMUSCULAR | Status: AC
  Administered 2019-12-03: 11:00:00 50 ug via INTRAVENOUS
  Filled 2019-12-03: qty 2

## 2019-12-03 MED ORDER — MORPHINE SULFATE (PF) 2 MG/ML IV SOLN: 0.5000 mg | INTRAVENOUS | Status: DC | PRN

## 2019-12-03 MED ORDER — ENOXAPARIN SODIUM 40 MG/0.4ML ~~LOC~~ SOLN
40.0000 mg | SUBCUTANEOUS | Status: DC
  Administered 2019-12-04 – 2019-12-07 (×4): 40 mg via SUBCUTANEOUS
  Filled 2019-12-03 (×4): qty 0.4

## 2019-12-03 MED ORDER — ROCURONIUM BROMIDE 50 MG/5ML IV SOSY
PREFILLED_SYRINGE | INTRAVENOUS | Status: DC | PRN
  Administered 2019-12-03: 20 mg via INTRAVENOUS
  Administered 2019-12-03: 40 mg via INTRAVENOUS

## 2019-12-03 SURGICAL SUPPLY — 55 items
BLADE SAW SAG 73X25 THK (BLADE) ×1
BLADE SAW SGTL 73X25 THK (BLADE) ×2 IMPLANT
BRUSH FEMORAL CANAL (MISCELLANEOUS) IMPLANT
BRUSH SCRUB EZ PLAIN DRY (MISCELLANEOUS) ×6 IMPLANT
COVER SURGICAL LIGHT HANDLE (MISCELLANEOUS) ×3 IMPLANT
COVER WAND RF STERILE (DRAPES) ×3 IMPLANT
DRAPE INCISE IOBAN 85X60 (DRAPES) ×3 IMPLANT
DRAPE ORTHO SPLIT 77X108 STRL (DRAPES) ×6
DRAPE SURG ORHT 6 SPLT 77X108 (DRAPES) ×2 IMPLANT
DRAPE U-SHAPE 47X51 STRL (DRAPES) ×3 IMPLANT
DRSG MEPILEX BORDER 4X8 (GAUZE/BANDAGES/DRESSINGS) ×3 IMPLANT
ELECT BLADE 6.5 EXT (BLADE) IMPLANT
ELECT CAUTERY BLADE 6.4 (BLADE) ×2 IMPLANT
ELECT REM PT RETURN 9FT ADLT (ELECTROSURGICAL) ×3
ELECTRODE REM PT RTRN 9FT ADLT (ELECTROSURGICAL) ×1 IMPLANT
GLOVE BIO SURGEON STRL SZ7.5 (GLOVE) ×3 IMPLANT
GLOVE BIO SURGEON STRL SZ8 (GLOVE) ×3 IMPLANT
GLOVE BIOGEL PI IND STRL 8 (GLOVE) ×2 IMPLANT
GLOVE BIOGEL PI INDICATOR 8 (GLOVE) ×4
GOWN STRL REUS W/ TWL LRG LVL3 (GOWN DISPOSABLE) ×2 IMPLANT
GOWN STRL REUS W/ TWL XL LVL3 (GOWN DISPOSABLE) ×1 IMPLANT
GOWN STRL REUS W/TWL 2XL LVL3 (GOWN DISPOSABLE) IMPLANT
GOWN STRL REUS W/TWL LRG LVL3 (GOWN DISPOSABLE) ×6
GOWN STRL REUS W/TWL XL LVL3 (GOWN DISPOSABLE) ×3
HANDPIECE INTERPULSE COAX TIP (DISPOSABLE)
HEAD FEM UNIPOLAR 47 OD STRL (Hips) ×2 IMPLANT
IMMOBILIZER KNEE 22 UNIV (SOFTGOODS) ×2 IMPLANT
KIT BASIN OR (CUSTOM PROCEDURE TRAY) ×3 IMPLANT
KIT TURNOVER KIT B (KITS) ×3 IMPLANT
MANIFOLD NEPTUNE II (INSTRUMENTS) ×3 IMPLANT
NDL 1/2 CIR MAYO (NEEDLE) IMPLANT
NEEDLE 1/2 CIR MAYO (NEEDLE) ×3 IMPLANT
NS IRRIG 1000ML POUR BTL (IV SOLUTION) ×3 IMPLANT
PACK TOTAL JOINT (CUSTOM PROCEDURE TRAY) ×3 IMPLANT
PAD ARMBOARD 7.5X6 YLW CONV (MISCELLANEOUS) ×6 IMPLANT
PILLOW ABDUCTION MEDIUM (MISCELLANEOUS) IMPLANT
PRESSURIZER FEMORAL UNIV (MISCELLANEOUS) IMPLANT
RETRIEVER SUT HEWSON (MISCELLANEOUS) ×3 IMPLANT
SET HNDPC FAN SPRY TIP SCT (DISPOSABLE) IMPLANT
SPACER FEM TAPERED +5 12/14 (Hips) ×2 IMPLANT
STAPLER VISISTAT 35W (STAPLE) ×3 IMPLANT
STEM SUMMIT BASIC PRESSFIT SZ4 (Hips) ×2 IMPLANT
SUT ETHILON 2 0 PSLX (SUTURE) ×4 IMPLANT
SUT FIBERWIRE #2 38 T-5 BLUE (SUTURE) ×6
SUT VIC AB 1 CT1 18XCR BRD 8 (SUTURE) ×1 IMPLANT
SUT VIC AB 1 CT1 27 (SUTURE) ×3
SUT VIC AB 1 CT1 27XBRD ANBCTR (SUTURE) ×1 IMPLANT
SUT VIC AB 1 CT1 8-18 (SUTURE) ×3
SUT VIC AB 2-0 CT1 27 (SUTURE) ×6
SUT VIC AB 2-0 CT1 TAPERPNT 27 (SUTURE) ×2 IMPLANT
SUTURE FIBERWR #2 38 T-5 BLUE (SUTURE) ×2 IMPLANT
TOWEL GREEN STERILE (TOWEL DISPOSABLE) ×3 IMPLANT
TOWEL GREEN STERILE FF (TOWEL DISPOSABLE) ×3 IMPLANT
TOWER CARTRIDGE SMART MIX (DISPOSABLE) IMPLANT
WATER STERILE IRR 1000ML POUR (IV SOLUTION) ×1 IMPLANT

## 2019-12-03 NOTE — Progress Notes (Signed)
Post fentanyl administration RN stayed in room to monitor patient. Prior to leaving room patient was sleeping but easily arousable.

## 2019-12-03 NOTE — ED Notes (Signed)
Report called to 5N RN unsuccessful. RN will call back.

## 2019-12-03 NOTE — Progress Notes (Signed)
PROGRESS NOTE    Joyce Perry  HER:[id number] DOB: [birth year] DOA: 12/02/2019 PCP: Gayland Curry, DO   Brief Narrative:  Joyce Perry is a 74 y.o. female with medical history significant of vascular dementia, CVA, DM 2    Presented with  A unwitnessed fall at home with left hip pain, lives at home with husband who is wheelchair-bound. Fall apparently was from standing position.  Patient secondary to significant dementia unable to provide a history of why she fell.  Denies being on any blood thinners EMS was called was given fentanyl in route initial blood pressure is 130/62 heart rate 66 on    Denies any nausea vomiting no headaches no numbness Family been isolated. Son states the bottom of her feet hurt a lot due to recent podiatry procedure and have had trouble with balance due to that  No hx of ETOH Tobacco  At base line able to walk a flight of stairs or to the mail box  She has no hx of CAD  Assessment & Plan:   Active Problems:   Chronic idiopathic constipation   History of cerebellar stroke   Controlled type 2 diabetes mellitus without complication, without long-term current use of insulin (HCC)   Femoral neck fracture (HCC)   Vascular dementia ([place])   DM (diabetes mellitus), type 2 (HCC)  Left femoral neck fracture after fall: Orthopedic on board.  Patient for surgery today.  Further management per them.  Vascular dementia: Monitor.  At risk for delirium.  Treat when necessary.  Type 2 diabetes mellitus.  Interestingly, hemoglobin A1c was 5.3 just 2 months ago and she is not on any home medications.  Doubt she has diabetes mellitus.  DVT prophylaxis: Per orthopedics postoperatively.  Going for surgery today.   Code Status: Full Code  Family Communication:  None present at bedside.  Patient is from: Home Disposition Plan: Possible SNF Barriers to discharge: Having surgery today   Estimated body mass index is 18.75 kg/m as calculated from the  following:   Height as of this encounter: 5\' 3"  (1.6 m).   Weight as of this encounter: 48 kg.      Nutritional status:               Consultants:   Orthopedics  Procedures:   None  Antimicrobials:  Anti-infectives (From admission, onward)   Start     Dose/Rate Route Frequency Ordered Stop   12/03/19 1030  ceFAZolin (ANCEF) IVPB 2g/100 mL premix     2 g 200 mL/hr over 30 Minutes Intravenous  Once 12/03/19 0914           Subjective: Seen and examined early morning.  She was confused, likely her baseline due to dementia.  Had no complaint.  Objective: Vitals:   12/03/19 0230 12/03/19 0245 12/03/19 0355 12/03/19 0745  BP: (!) 142/69 138/75 (!) 155/82 (!) 149/83  Pulse: (!) 59 60 74 62  Resp:   17 16  Temp:   98.5 F (36.9 C) 97.9 F (36.6 C)  TempSrc:   Oral Oral  SpO2: 96% 97% 97% 99%  Weight:      Height:       No intake or output data in the 24 hours ending 12/03/19 0931 Filed Weights   12/02/19 1607  Weight: 48 kg    Examination:  General exam: Appears calm and comfortable but confused Respiratory system: Clear to auscultation. Respiratory effort normal. Cardiovascular system: S1 & S2 heard, RRR. No JVD, murmurs,  rubs, gallops or clicks. No pedal edema. Gastrointestinal system: Abdomen is nondistended, soft and nontender. No organomegaly or masses felt. Normal bowel sounds heard. Central nervous system: Alert and oriented. No focal neurological deficits. Extremities: Symmetric 5 x 5 power. Skin: No rashes, lesions or ulcers Psychiatry: Judgement and insight appear poor. Mood & affect flat.   Data Reviewed: I have personally reviewed following labs and imaging studies  CBC: Recent Labs  Lab 12/02/19 1601 12/03/19 0510  WBC 5.6 6.3  HGB 12.3 12.2  HCT 38.7 37.4  MCV 96.5 94.4  PLT 208 199   Basic Metabolic Panel: Recent Labs  Lab 12/02/19 1601 12/03/19 0510  NA 140 135  K 3.8 3.9  CL 103 102  CO2 27 24  GLUCOSE 108* 111*    BUN 15 14  CREATININE 0.77 0.65  CALCIUM 9.0 8.8*   GFR: Estimated Creatinine Clearance: 45.3 mL/min (by C-G formula based on SCr of 0.65 mg/dL). Liver Function Tests: Recent Labs  Lab 12/02/19 1601  AST 27  ALT 17  ALKPHOS 89  BILITOT 0.5  PROT 6.8  ALBUMIN 3.8   No results for input(s): LIPASE, AMYLASE in the last 168 hours. No results for input(s): AMMONIA in the last 168 hours. Coagulation Profile: No results for input(s): INR, PROTIME in the last 168 hours. Cardiac Enzymes: No results for input(s): CKTOTAL, CKMB, CKMBINDEX, TROPONINI in the last 168 hours. BNP (last 3 results) No results for input(s): PROBNP in the last 8760 hours. HbA1C: No results for input(s): HGBA1C in the last 72 hours. CBG: Recent Labs  Lab 12/02/19 1620 12/03/19 0917  GLUCAP 106* 94   Lipid Profile: No results for input(s): CHOL, HDL, LDLCALC, TRIG, CHOLHDL, LDLDIRECT in the last 72 hours. Thyroid Function Tests: No results for input(s): TSH, T4TOTAL, FREET4, T3FREE, THYROIDAB in the last 72 hours. Anemia Panel: No results for input(s): VITAMINB12, FOLATE, FERRITIN, TIBC, IRON, RETICCTPCT in the last 72 hours. Sepsis Labs: No results for input(s): PROCALCITON, LATICACIDVEN in the last 168 hours.  Recent Results (from the past 240 hour(s))  SARS CORONAVIRUS 2 (TAT 6-24 HRS) Nasopharyngeal Nasopharyngeal Swab     Status: None   Collection Time: 12/02/19  8:27 PM   Specimen: Nasopharyngeal Swab  Result Value Ref Range Status   SARS Coronavirus 2 NEGATIVE NEGATIVE Final    Comment: (NOTE) SARS-CoV-2 target nucleic acids are NOT DETECTED. The SARS-CoV-2 RNA is generally detectable in upper and lower respiratory specimens during the acute phase of infection. Negative results do not preclude SARS-CoV-2 infection, do not rule out co-infections with other pathogens, and should not be used as the sole basis for treatment or other patient management decisions. Negative results must be  combined with clinical observations, patient history, and epidemiological information. The expected result is Negative. Fact Sheet for Patients: HairSlick.no Fact Sheet for Healthcare Providers: quierodirigir.com This test is not yet approved or cleared by the [place] FDA and  has been authorized for detection and/or diagnosis of SARS-CoV-2 by FDA under an Emergency Use Authorization (EUA). This EUA will remain  in effect (meaning this test can be used) for the duration of the COVID-19 declaration under Section 56 4(b)(1) of the Act, 21 U.S.C. section 360bbb-3(b)(1), unless the authorization is terminated or revoked sooner. Performed at [facility], [address], [place], [place] [address]       Radiology Studies: DG Knee 2 Views Left  Result Date: 12/02/2019 CLINICAL DATA:  Larey Seat from standing position, LEFT femur pain EXAM: LEFT  KNEE - 1-2 VIEW COMPARISON:  None FINDINGS: Osseous demineralization. Joint spaces preserved. No acute fracture, dislocation, or bone destruction. No joint effusion. IMPRESSION: No acute osseous abnormalities. Electronically Signed   By: Ulyses Southward M.D.   On: 12/02/2019 17:44   CT Head Wo Contrast  Result Date: 12/02/2019 CLINICAL DATA:  Acute pain due to trauma. EXAM: CT HEAD WITHOUT CONTRAST CT CERVICAL SPINE WITHOUT CONTRAST TECHNIQUE: Multidetector CT imaging of the head and cervical spine was performed following the standard protocol without intravenous contrast. Multiplanar CT image reconstructions of the cervical spine were also generated. COMPARISON:  CT head dated July 15, 2019 FINDINGS: CT HEAD FINDINGS Brain: No evidence of acute infarction, hemorrhage, hydrocephalus, extra-axial collection or mass lesion/mass effect. Encephalomalacia is noted involving the right cerebellum. Vascular: No hyperdense vessel or unexpected calcification. Skull: Normal. Negative for fracture or focal  lesion. Sinuses/Orbits: No acute finding. Other: None. CT CERVICAL SPINE FINDINGS Alignment: Normal. Skull base and vertebrae: No acute fracture. No primary bone lesion or focal pathologic process. Soft tissues and spinal canal: No prevertebral fluid or swelling. No visible canal hematoma. Disc levels: Mild multilevel disc height loss is noted throughout the cervical spine, greatest at the C4-C5 level. Upper chest: Negative. Other: None IMPRESSION: 1. No acute intracranial abnormality. 2. No acute cervical spine fracture. Electronically Signed   By: Katherine Mantle M.D.   On: 12/02/2019 19:07   CT Cervical Spine Wo Contrast  Result Date: 12/02/2019 CLINICAL DATA:  Acute pain due to trauma. EXAM: CT HEAD WITHOUT CONTRAST CT CERVICAL SPINE WITHOUT CONTRAST TECHNIQUE: Multidetector CT imaging of the head and cervical spine was performed following the standard protocol without intravenous contrast. Multiplanar CT image reconstructions of the cervical spine were also generated. COMPARISON:  CT head dated July 15, 2019 FINDINGS: CT HEAD FINDINGS Brain: No evidence of acute infarction, hemorrhage, hydrocephalus, extra-axial collection or mass lesion/mass effect. Encephalomalacia is noted involving the right cerebellum. Vascular: No hyperdense vessel or unexpected calcification. Skull: Normal. Negative for fracture or focal lesion. Sinuses/Orbits: No acute finding. Other: None. CT CERVICAL SPINE FINDINGS Alignment: Normal. Skull base and vertebrae: No acute fracture. No primary bone lesion or focal pathologic process. Soft tissues and spinal canal: No prevertebral fluid or swelling. No visible canal hematoma. Disc levels: Mild multilevel disc height loss is noted throughout the cervical spine, greatest at the C4-C5 level. Upper chest: Negative. Other: None IMPRESSION: 1. No acute intracranial abnormality. 2. No acute cervical spine fracture. Electronically Signed   By: Katherine Mantle M.D.   On: 12/02/2019  19:07   Chest Portable 1 View  Result Date: 12/02/2019 CLINICAL DATA:  Preoperative evaluation. EXAM: PORTABLE CHEST 1 VIEW COMPARISON:  Jan 02, 2008 FINDINGS: There is no evidence of acute infiltrate, pleural effusion or pneumothorax. A radiopaque Bobby pin is seen overlying the medial aspect of the left apex. The heart size and mediastinal contours are within normal limits. There is mild calcification of the aortic arch. A chronic fourth left rib fracture is seen. The visualized skeletal structures are otherwise unremarkable. IMPRESSION: No active disease. Electronically Signed   By: Aram Candela M.D.   On: 12/02/2019 21:40   DG Hip Unilat W or Wo Pelvis 2-3 Views Left  Result Date: 12/02/2019 CLINICAL DATA:  Larey Seat from standing position, pain at LEFT femur EXAM: DG HIP (WITH OR WITHOUT PELVIS) 2-3V LEFT COMPARISON:  None FINDINGS: Osseous demineralization. Hip and SI joint spaces preserved. Displaced subcapital fracture LEFT femoral neck. No dislocation. Visualized pelvis intact. Degenerative disc  and facet disease changes at visualized lower lumbar spine. IMPRESSION: Displaced subcapital fracture of the LEFT femoral neck. Electronically Signed   By: Ulyses Southward M.D.   On: 12/02/2019 17:43   DG Femur Min 2 Views Left  Result Date: 12/02/2019 CLINICAL DATA:  Fall. Known femoral neck fracture. Tenderness over the mid femur. EXAM: LEFT FEMUR 2 VIEWS COMPARISON:  Hip and knee series earlier today FINDINGS: Left femoral neck fracture again noted with varus angulation. No subluxation or dislocation. No additional acute femoral abnormality. No joint effusion within the left knee. IMPRESSION: Left femoral neck fracture with varus angulation. Electronically Signed   By: Charlett Nose M.D.   On: 12/02/2019 19:56    Scheduled Meds: . [MAR Hold] senna  1 tablet Oral BID   Continuous Infusions: .  ceFAZolin (ANCEF) IV    . lactated ringers    . [MAR Hold] methocarbamol (ROBAXIN) IV       LOS: 1 day    Time spent: 30 minutes   Hughie Closs, MD [facility]  12/03/2019, 9:31 AM   To contact the attending provider between 7A-7P or the covering provider during after hours 7P-7A, please log into the web site [email or web address].

## 2019-12-03 NOTE — Anesthesia Preprocedure Evaluation (Addendum)
Anesthesia Evaluation  Patient identified by MRN, date of birth, ID band Patient awake    Reviewed: Allergy & Precautions, NPO status , Patient's Chart, lab work & pertinent test results  Airway Mallampati: II  TM Distance: >3 FB Neck ROM: Full    Dental  (+) Edentulous Upper, Edentulous Lower   Pulmonary    breath sounds clear to auscultation       Cardiovascular  Rhythm:Regular Rate:Normal     Neuro/Psych    GI/Hepatic   Endo/Other  diabetes  Renal/GU      Musculoskeletal   Abdominal   Peds  Hematology   Anesthesia Other Findings   Reproductive/Obstetrics                            Anesthesia Physical Anesthesia Plan  ASA: III  Anesthesia Plan: General   Post-op Pain Management:    Induction: Intravenous  PONV Risk Score and Plan: Ondansetron  Airway Management Planned: Oral ETT  Additional Equipment:   Intra-op Plan:   Post-operative Plan: Extubation in OR  Informed Consent: I have reviewed the patients History and Physical, chart, labs and discussed the procedure including the risks, benefits and alternatives for the proposed anesthesia with the patient or authorized representative who has indicated his/her understanding and acceptance.       Plan Discussed with: CRNA and Anesthesiologist  Anesthesia Plan Comments:        Anesthesia Quick Evaluation

## 2019-12-03 NOTE — Consult Note (Signed)
Orthopaedic Trauma Service (OTS) Consult   Patient ID: Joyce Perry MRN: [id number] DOB/AGE: 76-01-1946 76 y.o.   Reason for Consult: Left femoral neck fracture Referring Physician: Toy Baker, MD (hospitalist)   HPI: Joyce Perry is an 74 y.o. female who sustained a ground-level fall at home.  Patient does have a history of vascular dementia.  History collection somewhat limited by this.  I did discuss with son over the telephone.  Patient does not use any assistive devices at baseline.  She ambulates independently.  She lives at home with her husband and is actually the primary caregiver of her husband who is wheelchair dependent.  There actually plans to move the patient and her husband to an apartment right next to her son's at the end of April.  Patient sustained a ground-level fall on 12/02/2019.  Patient had immediate onset of pain and inability to get up and bear weight.  Eventually EMS was called and brought the patient to [facility] where she was found to have a displaced femoral neck fracture.  Pain is located about the left hip.  There is no significant radiation of pain.  It is exacerbated with movement.  Relieved with rest and pain medication.  Pain is deep, sharp and severe in nature but does wax and wane.  Patient denies pain elsewhere and denies any additional injuries  No assistive devices at baseline Lives with disabled husband  History notes diabetes but she is not on any medications  No chronic anticoagulation   ? DEXA history    Past Medical History:  Diagnosis Date  . Arthritis   . Confusion   . Diabetes ([place])   . Memory loss   . Nervousness     History reviewed. No pertinent surgical history.  Family History  Problem Relation Age of Onset  . Diabetes Other     Social History:  reports that she has never smoked. She has never used smokeless tobacco. She reports that she does not drink alcohol or use  drugs.  Allergies: No Known Allergies  Medications: I have reviewed the patient's current medications.   Results for orders placed or performed during the hospital encounter of 12/02/19 (from the past 48 hour(s))  CBC     Status: None   Collection Time: 12/02/19  4:01 PM  Result Value Ref Range   WBC 5.6 4.0 - 10.5 K/uL   RBC 4.01 3.87 - 5.11 MIL/uL   Hemoglobin 12.3 12.0 - 15.0 g/dL   HCT 38.7 36.0 - 46.0 %   MCV 96.5 80.0 - 100.0 fL   MCH 30.7 26.0 - 34.0 pg   MCHC 31.8 30.0 - 36.0 g/dL   RDW 12.8 11.5 - 15.5 %   Platelets 208 150 - 400 K/uL   nRBC 0.0 0.0 - 0.2 %    Comment: Performed at [facility], [place] [address], [place], [place] [address]  Comprehensive metabolic panel     Status: Abnormal   Collection Time: 12/02/19  4:01 PM  Result Value Ref Range   Sodium 140 135 - 145 mmol/L   Potassium 3.8 3.5 - 5.1 mmol/L   Chloride 103 98 - 111 mmol/L   CO2 27 22 - 32 mmol/L   Glucose, Bld 108 (H) 70 - 99 mg/dL    Comment: Glucose reference range applies only to samples taken after fasting for at least 8 hours.   BUN 15 8 - 23 mg/dL  Creatinine, Ser 0.77 0.44 - 1.00 mg/dL   Calcium 9.0 8.9 - 19.1 mg/dL   Total Protein 6.8 6.5 - 8.1 g/dL   Albumin 3.8 3.5 - 5.0 g/dL   AST 27 15 - 41 U/L   ALT 17 0 - 44 U/L   Alkaline Phosphatase 89 38 - 126 U/L   Total Bilirubin 0.5 0.3 - 1.2 mg/dL   GFR calc non Af Amer >60 >60 mL/min   GFR calc Af Amer >60 >60 mL/min   Anion gap 10 5 - 15    Comment: Performed at [facility], [address] [place], [place] [address]  POC CBG, ED     Status: Abnormal   Collection Time: 12/02/19  4:20 PM  Result Value Ref Range   Glucose-Capillary 106 (H) 70 - 99 mg/dL    Comment: Glucose reference range applies only to samples taken after fasting for at least 8 hours.  SARS CORONAVIRUS 2 (TAT 6-24 HRS) Nasopharyngeal Nasopharyngeal Swab     Status: None   Collection Time: 12/02/19  8:27 PM   Specimen: Nasopharyngeal Swab  Result Value  Ref Range   SARS Coronavirus 2 NEGATIVE NEGATIVE    Comment: (NOTE) SARS-CoV-2 target nucleic acids are NOT DETECTED. The SARS-CoV-2 RNA is generally detectable in upper and lower respiratory specimens during the acute phase of infection. Negative results do not preclude SARS-CoV-2 infection, do not rule out co-infections with other pathogens, and should not be used as the sole basis for treatment or other patient management decisions. Negative results must be combined with clinical observations, patient history, and epidemiological information. The expected result is Negative. Fact Sheet for Patients: HairSlick.no Fact Sheet for Healthcare Providers: quierodirigir.com This test is not yet approved or cleared by the [place] FDA and  has been authorized for detection and/or diagnosis of SARS-CoV-2 by FDA under an Emergency Use Authorization (EUA). This EUA will remain  in effect (meaning this test can be used) for the duration of the COVID-19 declaration under Section 56 4(b)(1) of the Act, 21 U.S.C. section 360bbb-3(b)(1), unless the authorization is terminated or revoked sooner. Performed at [facility], [address] [place], [place] [address]   Type and screen     Status: None   Collection Time: 12/02/19  9:35 PM  Result Value Ref Range   ABO/RH(D) O POS    Antibody Screen NEG    Sample Expiration      12/05/2019,2359 Performed at [facility], [address] [place], [place] [address]   ABO/Rh     Status: None   Collection Time: 12/02/19  9:35 PM  Result Value Ref Range   ABO/RH(D)      O POS Performed at [facility], [address], [place], [place] [address]   CBC     Status: None   Collection Time: 12/03/19  5:10 AM  Result Value Ref Range   WBC 6.3 4.0 - 10.5 K/uL   RBC 3.96 3.87 - 5.11 MIL/uL   Hemoglobin 12.2 12.0 - 15.0 g/dL   HCT 29.5 28.4 - 13.2 %   MCV 94.4 80.0 - 100.0 fL    MCH 30.8 26.0 - 34.0 pg   MCHC 32.6 30.0 - 36.0 g/dL   RDW 44.0 10.2 - 72.5 %   Platelets 199 150 - 400 K/uL   nRBC 0.0 0.0 - 0.2 %    Comment: Performed at [facility], [address], [place], [place]  [id number]  Basic metabolic panel     Status: Abnormal   Collection Time: 12/03/19  5:10 AM  Result Value Ref Range   Sodium 135 135 - 145 mmol/L   Potassium 3.9 3.5 - 5.1 mmol/L   Chloride 102 98 - 111 mmol/L   CO2 24 22 - 32 mmol/L   Glucose, Bld 111 (H) 70 - 99 mg/dL    Comment: Glucose reference range applies only to samples taken after fasting for at least 8 hours.   BUN 14 8 - 23 mg/dL   Creatinine, Ser 3.470.65 0.44 - 1.00 mg/dL   Calcium 8.8 (L) 8.9 - 10.3 mg/dL   GFR calc non Af Amer >60 >60 mL/min   GFR calc Af Amer >60 >60 mL/min   Anion gap 9 5 - 15    Comment: Performed at [facility], [address] [place], [place] [id number]  VITAMIN D 25 Hydroxy (Vit-D Deficiency, Fractures)     Status: None   Collection Time: 12/03/19  5:10 AM  Result Value Ref Range   Vit D, 25-Hydroxy 73.75 30 - 100 ng/mL    Comment: (NOTE) Vitamin D deficiency has been defined by the Institute of Medicine  and an Endocrine Society practice guideline as a level of serum 25-OH  vitamin D less than 20 ng/mL (1,2). The Endocrine Society went on to  further define vitamin D insufficiency as a level between 21 and 29  ng/mL (2). 1. IOM (Institute of Medicine). 2010. Dietary reference intakes for  calcium and D. [place]: The [employer] Academ[employer] Press. 2. Holick MF, Binkley [place], Bischoff-Ferrari HA, et al. Evaluation,  treatment, and prevention of vitamin D deficiency: an Endocrine  Society clinical practice guideline, JCEM. 2011 Jul; 96(7): 1911-30. Performed at [facility], [address] [place], [place] [address]   Glucose, capillary     Status: None   Collection Time: 12/03/19  9:17 AM  Result Value Ref Range   Glucose-Capillary 94 70 - 99 mg/dL    Comment: Glucose  reference range applies only to samples taken after fasting for at least 8 hours.    DG Knee 2 Views Left  Result Date: 12/02/2019 CLINICAL DATA:  Larey SeatFell from standing position, LEFT femur pain EXAM: LEFT KNEE - 1-2 VIEW COMPARISON:  None FINDINGS: Osseous demineralization. Joint spaces preserved. No acute fracture, dislocation, or bone destruction. No joint effusion. IMPRESSION: No acute osseous abnormalities. Electronically Signed   By: Ulyses SouthwardMark  Boles M.D.   On: 12/02/2019 17:44   CT Head Wo Contrast  Result Date: 12/02/2019 CLINICAL DATA:  Acute pain due to trauma. EXAM: CT HEAD WITHOUT CONTRAST CT CERVICAL SPINE WITHOUT CONTRAST TECHNIQUE: Multidetector CT imaging of the head and cervical spine was performed following the standard protocol without intravenous contrast. Multiplanar CT image reconstructions of the cervical spine were also generated. COMPARISON:  CT head dated July 15, 2019 FINDINGS: CT HEAD FINDINGS Brain: No evidence of acute infarction, hemorrhage, hydrocephalus, extra-axial collection or mass lesion/mass effect. Encephalomalacia is noted involving the right cerebellum. Vascular: No hyperdense vessel or unexpected calcification. Skull: Normal. Negative for fracture or focal lesion. Sinuses/Orbits: No acute finding. Other: None. CT CERVICAL SPINE FINDINGS Alignment: Normal. Skull base and vertebrae: No acute fracture. No primary bone lesion or focal pathologic process. Soft tissues and spinal canal: No prevertebral fluid or swelling. No visible canal hematoma. Disc levels: Mild multilevel disc height loss is noted throughout the cervical spine, greatest at the C4-C5 level. Upper chest: Negative. Other:  None IMPRESSION: 1. No acute intracranial abnormality. 2. No acute cervical spine fracture. Electronically Signed   By: Katherine Mantle M.D.   On: 12/02/2019 19:07   CT Cervical Spine Wo Contrast  Result Date: 12/02/2019 CLINICAL DATA:  Acute pain due to trauma. EXAM: CT HEAD WITHOUT  CONTRAST CT CERVICAL SPINE WITHOUT CONTRAST TECHNIQUE: Multidetector CT imaging of the head and cervical spine was performed following the standard protocol without intravenous contrast. Multiplanar CT image reconstructions of the cervical spine were also generated. COMPARISON:  CT head dated July 15, 2019 FINDINGS: CT HEAD FINDINGS Brain: No evidence of acute infarction, hemorrhage, hydrocephalus, extra-axial collection or mass lesion/mass effect. Encephalomalacia is noted involving the right cerebellum. Vascular: No hyperdense vessel or unexpected calcification. Skull: Normal. Negative for fracture or focal lesion. Sinuses/Orbits: No acute finding. Other: None. CT CERVICAL SPINE FINDINGS Alignment: Normal. Skull base and vertebrae: No acute fracture. No primary bone lesion or focal pathologic process. Soft tissues and spinal canal: No prevertebral fluid or swelling. No visible canal hematoma. Disc levels: Mild multilevel disc height loss is noted throughout the cervical spine, greatest at the C4-C5 level. Upper chest: Negative. Other: None IMPRESSION: 1. No acute intracranial abnormality. 2. No acute cervical spine fracture. Electronically Signed   By: Katherine Mantle M.D.   On: 12/02/2019 19:07   Chest Portable 1 View  Result Date: 12/02/2019 CLINICAL DATA:  Preoperative evaluation. EXAM: PORTABLE CHEST 1 VIEW COMPARISON:  Jan 02, 2008 FINDINGS: There is no evidence of acute infiltrate, pleural effusion or pneumothorax. A radiopaque Bobby pin is seen overlying the medial aspect of the left apex. The heart size and mediastinal contours are within normal limits. There is mild calcification of the aortic arch. A chronic fourth left rib fracture is seen. The visualized skeletal structures are otherwise unremarkable. IMPRESSION: No active disease. Electronically Signed   By: Aram Candela M.D.   On: 12/02/2019 21:40   DG Hip Unilat W or Wo Pelvis 2-3 Views Left  Result Date: 12/02/2019 CLINICAL DATA:   Larey Seat from standing position, pain at LEFT femur EXAM: DG HIP (WITH OR WITHOUT PELVIS) 2-3V LEFT COMPARISON:  None FINDINGS: Osseous demineralization. Hip and SI joint spaces preserved. Displaced subcapital fracture LEFT femoral neck. No dislocation. Visualized pelvis intact. Degenerative disc and facet disease changes at visualized lower lumbar spine. IMPRESSION: Displaced subcapital fracture of the LEFT femoral neck. Electronically Signed   By: Ulyses Southward M.D.   On: 12/02/2019 17:43   DG Femur Min 2 Views Left  Result Date: 12/02/2019 CLINICAL DATA:  Fall. Known femoral neck fracture. Tenderness over the mid femur. EXAM: LEFT FEMUR 2 VIEWS COMPARISON:  Hip and knee series earlier today FINDINGS: Left femoral neck fracture again noted with varus angulation. No subluxation or dislocation. No additional acute femoral abnormality. No joint effusion within the left knee. IMPRESSION: Left femoral neck fracture with varus angulation. Electronically Signed   By: Charlett Nose M.D.   On: 12/02/2019 19:56    Review of Systems  Unable to perform ROS: Dementia   Blood pressure (!) 149/83, pulse 62, temperature 97.9 F (36.6 C), temperature source Oral, resp. rate 16, height  (1.6 m), weight 48 kg, SpO2 99 %. Physical Exam Vitals and nursing note reviewed. Exam conducted with a chaperone present.  Constitutional:      General: She is awake. She is not in acute distress.    Appearance: She is well-developed and well-groomed.  HENT:     Head: Normocephalic and atraumatic.  Mouth/Throat:     Mouth: Mucous membranes are moist.     Pharynx: Oropharynx is clear.  Eyes:     Extraocular Movements: Extraocular movements intact.  Cardiovascular:     Rate and Rhythm: Normal rate and regular rhythm.     Heart sounds: S1 normal and S2 normal.  Pulmonary:     Effort: Pulmonary effort is normal. No accessory muscle usage or respiratory distress.     Breath sounds: Normal breath sounds and air entry. No  decreased breath sounds.     Comments: Symmetric chest rise and fall  Abdominal:     Comments: Soft, NTND, + BS   Musculoskeletal:     Comments: Pelvis      no traumatic wounds or rash, no ecchymosis, stable to manual stress, nontender  Left Lower Extremity  Inspection:    Left leg/hip resting in a shortened, abducted and externally rotated position      No open/traumatic wounds noted      No knee or ankle effusion  Bony eval:     TTP L hip      Did not manipulate L leg due to femoral neck fracture       Knee nontender      Lower leg, ankle and foot are nontender  Soft tissue:     No appreciable swelling to L leg     No traumatic wounds     No soft tissues concerns noted over soft tissue of L hip      Soft tissue is mobile over hip      No knee effusion      No ankle effusion  ROM:     Not assessed due to position of leg and acute fracture  Sensation:    DPN, SPN, TN sensation appear to be grossly intact Motor:    EHL, FHL, lesser toe motor grossly intact    Ankle flexion, extension, inversion and eversion are grossly intact  Vascular:   + DP pulse    No DCT     Compartments are soft     Ext is warm     No pitting edema  Special Test:      Did not assess station or gait due to acute femoral neck fracture   Right Lower Extremity              no open wounds or lesions, no swelling or ecchymosis   Nontender hip, knee, ankle and foot             No crepitus or gross motion noted with manipulation of the R leg  No knee or ankle effusion             No pain with axial loading or logrolling of the hip. Negative Stinchfield test   Knee stable to varus/ valgus and anterior/posterior stress             No pain with manipulation of the ankle or foot             No blocks to motion noted  Sens DPN, SPN, TN intact  Motor EHL, FHL, lesser toe motor, Ext, flex, evers 5/5  DP 2+, No significant edema             Compartments are soft and nontender, no pain with passive  stretching  Bilateral upper extremities        shoulder, elbow, wrist, digits- no skin wounds, nontender, no instability, no blocks to motion  Sens  Ax/R/M/U intact  Mot   Ax/ R/ PIN/ M/ AIN/ U intact  Rad 2+     Skin:    General: Skin is warm.     Capillary Refill: Capillary refill takes less than 2 seconds.  Neurological:     Mental Status: Mental status is at baseline.  Psychiatric:        Attention and Perception: Attention normal.        Mood and Affect: Mood normal.        Behavior: Behavior is cooperative.        Cognition and Memory: She exhibits impaired remote memory.     Assessment/Plan:  74 y/o female s/p ground level fall with Left femoral neck fracture, vascular dementia   -ground level fall with fragility fracture L femoral neck   - L femoral neck fracture  OR today for L hip hemiarthroplasty   Discussed with son    Agreeable to proceed with the procedure   This is the standard of care with this particular injury.  Hemiarthroplasty will also give pt best chance of getting back to baseline function   WBAT post op   Posterior hip precautions post op   PT/OT evals post op    Will likely need SNF after acute hospital stay.  Son also wants to discuss with SW about possibly looking into ALF for both parents   - Pain management:  Scheduled tylenol  Minimize opioids   - ABL anemia/Hemodynamics  Stable    Monitor   - Medical issues   Per primary   - DVT/PE prophylaxis:  Lovenox x 28 days post op   - ID:   periop abx  - Metabolic Bone Disease:  Vitamin d levels look good   Fracture suggestive of osteoporosis  Recommend DEXA in 4-8 weeks to quantify   Would likely benefit from pharmacologic management of osteoporosis to prevent secondary fracture  Would also recommend aggressive therapy for strengthening and conditioning which will also help improve bone quality and improve balance   - Activity:  WBAT post op with walker   Therapy evals post  op  - FEN/GI prophylaxis/Foley/Lines:  NPO   Advance diet post op    - Impediments to fracture healing:  Osteoporosis  Cognitive deficits   - Dispo:  OR for L hip hemiarthroplasty     Mearl Latin, PA-C [phone] (C) 12/03/2019, 9:32 AM  [facility] [address] [place] [address] [phone] Collier Bullock (F)

## 2019-12-03 NOTE — Plan of Care (Signed)
  Problem: Education: Goal: Knowledge of General Education information will improve Description: Including pain rating scale, medication(s)/side effects and non-pharmacologic comfort measures Outcome: Progressing   Problem: Clinical Measurements: Goal: Respiratory complications will improve Outcome: Progressing Note: On room air   Problem: Nutrition: Goal: Adequate nutrition will be maintained Outcome: Progressing   Problem: Coping: Goal: Level of anxiety will decrease Outcome: Progressing   Problem: Elimination: Goal: Will not experience complications related to urinary retention Outcome: Progressing Note: Foley in place   Problem: Pain Managment: Goal: General experience of comfort will improve Outcome: Progressing Note: Has not wanted anything for pain post op, scheduled tylenol given   Problem: Safety: Goal: Ability to remain free from injury will improve Outcome: Progressing

## 2019-12-03 NOTE — Transfer of Care (Signed)
Immediate Anesthesia Transfer of Care Note  Patient: Joyce Perry  Procedure(s) Performed: ARTHROPLASTY BIPOLAR HIP (HEMIARTHROPLASTY) (Left Hip)  Patient Location: PACU  Anesthesia Type:General  Level of Consciousness: awake, alert  and oriented  Airway & Oxygen Therapy: Patient Spontanous Breathing and Patient connected to nasal cannula oxygen  Post-op Assessment: Report given to RN and Post -op Vital signs reviewed and stable  Post vital signs: Reviewed and stable  Last Vitals:  Vitals Value Taken Time  BP 168/71 12/03/19 1408  Temp    Pulse 56 12/03/19 1413  Resp 9 12/03/19 1413  SpO2 100 % 12/03/19 1413  Vitals shown include unvalidated device data.  Last Pain:  Vitals:   12/03/19 1408  TempSrc:   PainSc: (P) Asleep         Complications: No apparent anesthesia complications

## 2019-12-03 NOTE — Anesthesia Procedure Notes (Signed)
Procedure Name: Intubation Date/Time: 12/03/2019 12:02 PM Performed by: Mariea Clonts, CRNA Pre-anesthesia Checklist: Patient identified, Emergency Drugs available, Suction available and Patient being monitored Patient Re-evaluated:Patient Re-evaluated prior to induction Oxygen Delivery Method: Circle system utilized Preoxygenation: Pre-oxygenation with 100% oxygen Induction Type: IV induction Ventilation: Mask ventilation without difficulty and Oral airway inserted - appropriate to patient size Laryngoscope Size: Mac and 3 Grade View: Grade I Tube type: Oral Tube size: 7.0 mm Number of attempts: 1 Airway Equipment and Method: Stylet Placement Confirmation: ETT inserted through vocal cords under direct vision,  positive ETCO2 and breath sounds checked- equal and bilateral Secured at: 20 cm Tube secured with: Tape Dental Injury: Teeth and Oropharynx as per pre-operative assessment

## 2019-12-03 NOTE — Op Note (Signed)
12/03/2019  PATIENT:  Joyce Perry  1945/12/31  PRE-OPERATIVE DIAGNOSIS:  DISPLACED LEFT FEMORAL NECK FRACTURE  POST-OPERATIVE DIAGNOSIS:  DISPLACED LEFT FEMORAL NECK FRACTURE  PROCEDURE:  Procedure(s): UNIPOLAR HEMIARTHROPLASTY OF THE LET HIP with [place] Basic #4 femoral stem, +5 neck, 47 mm head  SURGEON:  Surgeon(s) and Role:    Altamese Leonard, MD - Primary  PHYSICIAN ASSISTANT: Ainsley Spinner, PA-C  ANESTHESIA:   general  EBL:  150 mL   BLOOD ADMINISTERED:none  DRAINS: none   UOP: 800 cc  LOCAL MEDICATIONS USED:  NONE  SPECIMEN:  No Specimen  DISPOSITION OF SPECIMEN:  N/A  COUNTS:  YES  TOURNIQUET:  * No tourniquets in log *  DICTATION: Note written in EPIC  PLAN OF CARE: Admit to inpatient   PATIENT DISPOSITION:  PACU - hemodynamically stable.   Delay start of Pharmacological VTE agent (>24hrs) due to surgical blood loss or risk of bleeding: no  BRIEF SUMMARY OF INDICATION FOR PROCEDURE:  Joyce Perry is a very pleasant 74 y.o. with dementia, who sustained an unwitnessed fall producing inability to bear weight, shortening, and external rotation of the extremity.  She was seen and evaluated with the recommendation for hemiarthroplasty. I discussed with the family the risks and benefits, inclding the potential for dislocation or instability, arthritis, loss of motion, DVT, PE, heart attack, stroke, and death.  Consent was given to proceed.  BRIEF SUMMARY OF PROCEDURE:  The patient was taken to the operating room where general anesthesia was induced and after administration of preoperative antibiotics consisting of 2 g of Ancef.  She was positioned with the left side up and all prominences were padded appropriately.  We made a 8 cm incision after the time-out, carrying dissection down to the IT band, was split in line with the skin.  Cerebellar retractor was placed and we were able to then flex and internally rotate the hip releasing the piriformis  and short rotators, which were confluent, at their insertions.  The capsule was then T'd, tagging the corners with #1 Vicryl.  The neck cut was refined using a cutting guide and then this was followed by removal of the head, which sized perfectly to 47 mm. Acetabular trials were placed, confirming this size as the best fit. Mueller and Cobra retractors were placed along the proximal femur, which was then prepared with the canal finder,  then lateralizer, followed by reamers up to #5, and the broaches, achieving  outstanding fit and fill with the #4 broach.  The canal was irrigated thoroughly and the acetabulum once again searched multiple times for fragments and irrigated thoroughly.  Trial components were placed and the patient had outstanding stability in combine 90 degrees of flexion, adduction, and internal rotation as well as in external rotation and extension. In spite of the outstanding stability there remained axial shuck and so decision made to size up to +5 mm.  Consequently, actual components were placed.  My assistant Ainsley Spinner, was necessary for delivery and control of the proximal femur during preparation, also during relocation and dislocation of the trial components as well as relocation of the actual components.  He assisted me with wound closure as well.  I did repair the capsule with #1 Vicryl and then used #2 FiberWire through bone tunnels to repair the short rotators and piriformis.  This was followed by a #1 Vicryl for the IT band and lastly 2-0 Vicryl and nylon for the subcutaneous and skin.  Sterile gently compressive  dressing was applied.  The patient was awakened from anesthesia and transported to the PACU in stable condition.  PROGNOSIS:  The patient will be weightbearing as tolerated with posterior hip precautions.  Patient has an elevated risk of complications related to declining overall health and mobility.  Joyce Perry remains on the Medical Service and  will be on DVT prophylaxis mechanically and with Lovenox while in the hospital, but will likely not be a candidate for long-term prophylaxis given the dementia.     Joyce Perry. Carola Frost, M.D.

## 2019-12-04 ENCOUNTER — Encounter: Payer: Self-pay | Admitting: *Deleted

## 2019-12-04 LAB — BASIC METABOLIC PANEL
Anion gap: 12 (ref 5–15)
BUN: 11 mg/dL (ref 8–23)
CO2: 25 mmol/L (ref 22–32)
Calcium: 8.7 mg/dL — ABNORMAL LOW (ref 8.9–10.3)
Chloride: 97 mmol/L — ABNORMAL LOW (ref 98–111)
Creatinine, Ser: 0.88 mg/dL (ref 0.44–1.00)
GFR calc Af Amer: >60 mL/min (ref >60)
GFR calc non Af Amer: >60 mL/min (ref >60)
Glucose, Bld: 181 mg/dL — ABNORMAL HIGH (ref 70–99)
Potassium: 3.7 mmol/L (ref 3.5–5.1)
Sodium: 134 mmol/L — ABNORMAL LOW (ref 135–145)

## 2019-12-04 LAB — CBC WITH DIFFERENTIAL/PLATELET
Abs Immature Granulocytes: 0.04 10*3/uL (ref 0.00–0.07)
Basophils Absolute: 0 10*3/uL (ref 0.0–0.1)
Basophils Relative: 0 %
Eosinophils Absolute: 0 10*3/uL (ref 0.0–0.5)
Eosinophils Relative: 0 %
HCT: 35.2 % — ABNORMAL LOW (ref 36.0–46.0)
Hemoglobin: 11.7 g/dL — ABNORMAL LOW (ref 12.0–15.0)
Immature Granulocytes: 1 %
Lymphocytes Relative: 10 %
Lymphs Abs: 0.8 10*3/uL (ref 0.7–4.0)
MCH: 31.2 pg (ref 26.0–34.0)
MCHC: 33.2 g/dL (ref 30.0–36.0)
MCV: 93.9 fL (ref 80.0–100.0)
Monocytes Absolute: 0.8 10*3/uL (ref 0.1–1.0)
Monocytes Relative: 11 %
Neutro Abs: 5.9 10*3/uL (ref 1.7–7.7)
Neutrophils Relative %: 78 %
Platelets: 189 10*3/uL (ref 150–400)
RBC: 3.75 MIL/uL — ABNORMAL LOW (ref 3.87–5.11)
RDW: 12.8 % (ref 11.5–15.5)
WBC: 7.6 10*3/uL (ref 4.0–10.5)
nRBC: 0 % (ref 0.0–0.2)

## 2019-12-04 MED ORDER — MUPIROCIN 2 % EX OINT
1.0000 "application " | TOPICAL_OINTMENT | Freq: Two times a day (BID) | CUTANEOUS | Status: DC
  Administered 2019-12-04 – 2019-12-07 (×7): 1 via NASAL
  Filled 2019-12-04: qty 22

## 2019-12-04 NOTE — Anesthesia Postprocedure Evaluation (Signed)
Anesthesia Post Note  Patient: Joyce Perry  Procedure(s) Performed: ARTHROPLASTY BIPOLAR HIP (HEMIARTHROPLASTY) (Left Hip)     Patient location during evaluation: PACU Anesthesia Type: General Level of consciousness: awake and alert Pain management: pain level controlled Vital Signs Assessment: post-procedure vital signs reviewed and stable Respiratory status: spontaneous breathing, nonlabored ventilation, respiratory function stable and patient connected to nasal cannula oxygen Cardiovascular status: blood pressure returned to baseline and stable Postop Assessment: no apparent nausea or vomiting Anesthetic complications: no    Last Vitals:  Vitals:   12/04/19 0737 12/04/19 1314  BP: 100/60 119/74  Pulse: 64 87  Resp: 14 16  Temp: 36.8 C 37.1 C  SpO2: 96% 99%    Last Pain:  Vitals:   12/04/19 1314  TempSrc: Oral  PainSc:                  Joyce Perry

## 2019-12-04 NOTE — Progress Notes (Addendum)
Patient ID: Joyce Perry, female   DOB: 20-Oct-1945, 74 y.o.   MRN: [id number]  PROGRESS NOTE    Joyce Perry  MHD:[id number] DOB: [birth year] DOA: 12/02/2019 PCP: Gayland Curry, DO   Brief Narrative:  74 year old female with history of vascular dementia, CVA, diabetes mellitus type 2 presented with left hip pain after fall at home.  She was found to have left femoral neck fracture for which she underwent operative intervention on 12/03/2019 by orthopedics.  Assessment & Plan:   Left femoral neck fracture after a fall -Status post hemiarthroplasty of the left hip on 12/03/2019 by orthopedics. -Follow further orthopedics recommendations.  PT/OT eval.  Fall precautions.  Wound care/activity as per orthopedics recommendations. -Pain management.  Bowel regimen. -Might need SNF placement  Vascular dementia -Monitor.  At risk for delirium.  Diabetes mellitus type 2 -Hemoglobin A1c was 5.3 2 months ago.  She is not on any home medications.  Blood sugars stable    DVT prophylaxis: Lovenox Code Status: Full Family Communication: None at bedside Disposition Plan: Might need SNF placement; pending PT eval.  Consultants: Orthopedics  Procedures: Left knee hemiarthroplasty on 12/03/2019 by orthopedics  Antimicrobials: Perioperative   Subjective: Patient seen and examined at bedside.  She is awake but a poor historian and slightly confused.  No worsening hip pain reported.  No overnight fever or vomiting reported.  Objective: Vitals:   12/03/19 1934 12/03/19 2348 12/04/19 0401 12/04/19 0737  BP: 110/67 112/77 102/68 100/60  Pulse: 67 74 67 64  Resp: 15 16 15 14   Temp: 98 F (36.7 C) 98 F (36.7 C) 98.4 F (36.9 C) 98.2 F (36.8 C)  TempSrc: Oral Oral Oral Oral  SpO2: 99% 96% 98% 96%  Weight:      Height:        Intake/Output Summary (Last 24 hours) at 12/04/2019 1051 Last data filed at 12/04/2019 0555 Gross per 24 hour  Intake 1461.47 ml  Output 1750 ml  Net -288.53 ml    Filed Weights   12/02/19 1607  Weight: 48 kg    Examination:  General exam: Appears calm and comfortable.  Awake and pleasantly confused slightly. Respiratory system: Bilateral decreased breath sounds at bases Cardiovascular system: S1 & S2 heard, Rate controlled Gastrointestinal system: Abdomen is nondistended, soft and nontender. Normal bowel sounds heard. Extremities: No cyanosis, clubbing, edema    Data Reviewed: I have personally reviewed following labs and imaging studies  CBC: Recent Labs  Lab 12/02/19 1601 12/03/19 0510 12/04/19 0147  WBC 5.6 6.3 7.6  NEUTROABS  --   --  5.9  HGB 12.3 12.2 11.7*  HCT 38.7 37.4 35.2*  MCV 96.5 94.4 93.9  PLT 208 199 211   Basic Metabolic Panel: Recent Labs  Lab 12/02/19 1601 12/03/19 0510 12/04/19 0147  NA 140 135 134*  K 3.8 3.9 3.7  CL 103 102 97*  CO2 27 24 25   GLUCOSE 108* 111* 181*  BUN 15 14 11   CREATININE 0.77 0.65 0.88  CALCIUM 9.0 8.8* 8.7*   GFR: Estimated Creatinine Clearance: 41.2 mL/min (by C-G formula based on SCr of 0.88 mg/dL). Liver Function Tests: Recent Labs  Lab 12/02/19 1601  AST 27  ALT 17  ALKPHOS 89  BILITOT 0.5  PROT 6.8  ALBUMIN 3.8   No results for input(s): LIPASE, AMYLASE in the last 168 hours. No results for input(s): AMMONIA in the last 168 hours. Coagulation Profile: No results for input(s): INR, PROTIME in the last 168 hours.  Cardiac Enzymes: No results for input(s): CKTOTAL, CKMB, CKMBINDEX, TROPONINI in the last 168 hours. BNP (last 3 results) No results for input(s): PROBNP in the last 8760 hours. HbA1C: No results for input(s): HGBA1C in the last 72 hours. CBG: Recent Labs  Lab 12/02/19 1620 12/03/19 0917 12/03/19 1117 12/03/19 1408  GLUCAP 106* 94 101* 116*   Lipid Profile: No results for input(s): CHOL, HDL, LDLCALC, TRIG, CHOLHDL, LDLDIRECT in the last 72 hours. Thyroid Function Tests: No results for input(s): TSH, T4TOTAL, FREET4, T3FREE, THYROIDAB in  the last 72 hours. Anemia Panel: No results for input(s): VITAMINB12, FOLATE, FERRITIN, TIBC, IRON, RETICCTPCT in the last 72 hours. Sepsis Labs: No results for input(s): PROCALCITON, LATICACIDVEN in the last 168 hours.  Recent Results (from the past 240 hour(s))  SARS CORONAVIRUS 2 (TAT 6-24 HRS) Nasopharyngeal Nasopharyngeal Swab     Status: None   Collection Time: 12/02/19  8:27 PM   Specimen: Nasopharyngeal Swab  Result Value Ref Range Status   SARS Coronavirus 2 NEGATIVE NEGATIVE Final    Comment: (NOTE) SARS-CoV-2 target nucleic acids are NOT DETECTED. The SARS-CoV-2 RNA is generally detectable in upper and lower respiratory specimens during the acute phase of infection. Negative results do not preclude SARS-CoV-2 infection, do not rule out co-infections with other pathogens, and should not be used as the sole basis for treatment or other patient management decisions. Negative results must be combined with clinical observations, patient history, and epidemiological information. The expected result is Negative. Fact Sheet for Patients: HairSlick.no Fact Sheet for Healthcare Providers: quierodirigir.com This test is not yet approved or cleared by the [place] FDA and  has been authorized for detection and/or diagnosis of SARS-CoV-2 by FDA under an Emergency Use Authorization (EUA). This EUA will remain  in effect (meaning this test can be used) for the duration of the COVID-19 declaration under Section 56 4(b)(1) of the Act, 21 U.S.C. section 360bbb-3(b)(1), unless the authorization is terminated or revoked sooner. Performed at [facility], [address], [place], [place] [address]   Surgical pcr screen     Status: Abnormal   Collection Time: 12/03/19  8:10 AM   Specimen: Nasal Mucosa; Nasal Swab  Result Value Ref Range Status   MRSA, PCR NEGATIVE NEGATIVE Final   Staphylococcus aureus POSITIVE (A) NEGATIVE  Final    Comment: (NOTE) The Xpert SA Assay (FDA approved for NASAL specimens in patients 1 years of age and older), is one component of a comprehensive surveillance program. It is not intended to diagnose infection nor to guide or monitor treatment. Performed at [facility], [address], [place], [place] [address]          Radiology Studies: DG Knee 2 Views Left  Result Date: 12/02/2019 CLINICAL DATA:  Larey Seat from standing position, LEFT femur pain EXAM: LEFT KNEE - 1-2 VIEW COMPARISON:  None FINDINGS: Osseous demineralization. Joint spaces preserved. No acute fracture, dislocation, or bone destruction. No joint effusion. IMPRESSION: No acute osseous abnormalities. Electronically Signed   By: Ulyses Southward M.D.   On: 12/02/2019 17:44   CT Head Wo Contrast  Result Date: 12/02/2019 CLINICAL DATA:  Acute pain due to trauma. EXAM: CT HEAD WITHOUT CONTRAST CT CERVICAL SPINE WITHOUT CONTRAST TECHNIQUE: Multidetector CT imaging of the head and cervical spine was performed following the standard protocol without intravenous contrast. Multiplanar CT image reconstructions of the cervical spine were also generated. COMPARISON:  CT head dated July 15, 2019 FINDINGS: CT HEAD FINDINGS Brain:  No evidence of acute infarction, hemorrhage, hydrocephalus, extra-axial collection or mass lesion/mass effect. Encephalomalacia is noted involving the right cerebellum. Vascular: No hyperdense vessel or unexpected calcification. Skull: Normal. Negative for fracture or focal lesion. Sinuses/Orbits: No acute finding. Other: None. CT CERVICAL SPINE FINDINGS Alignment: Normal. Skull base and vertebrae: No acute fracture. No primary bone lesion or focal pathologic process. Soft tissues and spinal canal: No prevertebral fluid or swelling. No visible canal hematoma. Disc levels: Mild multilevel disc height loss is noted throughout the cervical spine, greatest at the C4-C5 level. Upper chest: Negative. Other: None  IMPRESSION: 1. No acute intracranial abnormality. 2. No acute cervical spine fracture. Electronically Signed   By: Katherine Mantle M.D.   On: 12/02/2019 19:07   CT Cervical Spine Wo Contrast  Result Date: 12/02/2019 CLINICAL DATA:  Acute pain due to trauma. EXAM: CT HEAD WITHOUT CONTRAST CT CERVICAL SPINE WITHOUT CONTRAST TECHNIQUE: Multidetector CT imaging of the head and cervical spine was performed following the standard protocol without intravenous contrast. Multiplanar CT image reconstructions of the cervical spine were also generated. COMPARISON:  CT head dated July 15, 2019 FINDINGS: CT HEAD FINDINGS Brain: No evidence of acute infarction, hemorrhage, hydrocephalus, extra-axial collection or mass lesion/mass effect. Encephalomalacia is noted involving the right cerebellum. Vascular: No hyperdense vessel or unexpected calcification. Skull: Normal. Negative for fracture or focal lesion. Sinuses/Orbits: No acute finding. Other: None. CT CERVICAL SPINE FINDINGS Alignment: Normal. Skull base and vertebrae: No acute fracture. No primary bone lesion or focal pathologic process. Soft tissues and spinal canal: No prevertebral fluid or swelling. No visible canal hematoma. Disc levels: Mild multilevel disc height loss is noted throughout the cervical spine, greatest at the C4-C5 level. Upper chest: Negative. Other: None IMPRESSION: 1. No acute intracranial abnormality. 2. No acute cervical spine fracture. Electronically Signed   By: Katherine Mantle M.D.   On: 12/02/2019 19:07   Chest Portable 1 View  Result Date: 12/02/2019 CLINICAL DATA:  Preoperative evaluation. EXAM: PORTABLE CHEST 1 VIEW COMPARISON:  Jan 02, 2008 FINDINGS: There is no evidence of acute infiltrate, pleural effusion or pneumothorax. A radiopaque Bobby pin is seen overlying the medial aspect of the left apex. The heart size and mediastinal contours are within normal limits. There is mild calcification of the aortic arch. A chronic fourth  left rib fracture is seen. The visualized skeletal structures are otherwise unremarkable. IMPRESSION: No active disease. Electronically Signed   By: Aram Candela M.D.   On: 12/02/2019 21:40   DG Hip Port Unilat With Pelvis 1V Left  Result Date: 12/03/2019 CLINICAL DATA:  Total hip replacement for fracture EXAM: DG HIP (WITH OR WITHOUT PELVIS) 2V PORT LEFT COMPARISON:  Left hip radiographs December 02, 2019 FINDINGS: Frontal pelvis as well as lateral left hip images obtained. There is a total hip replacement on the left with prosthetic components well-seated. No fracture or dislocation. Air on the left is an expected postoperative finding. Right hip joint appears unremarkable. IMPRESSION: Status post total hip replacement on the left with prosthetic components well-seated. No fracture or dislocation. Right hip joint unremarkable. Electronically Signed   By: Bretta Bang III M.D.   On: 12/03/2019 14:38   DG Hip Unilat W or Wo Pelvis 2-3 Views Left  Result Date: 12/02/2019 CLINICAL DATA:  Larey Seat from standing position, pain at LEFT femur EXAM: DG HIP (WITH OR WITHOUT PELVIS) 2-3V LEFT COMPARISON:  None FINDINGS: Osseous demineralization. Hip and SI joint spaces preserved. Displaced subcapital fracture LEFT femoral neck. No dislocation.  Visualized pelvis intact. Degenerative disc and facet disease changes at visualized lower lumbar spine. IMPRESSION: Displaced subcapital fracture of the LEFT femoral neck. Electronically Signed   By: Ulyses Southward M.D.   On: 12/02/2019 17:43   DG Femur Min 2 Views Left  Result Date: 12/02/2019 CLINICAL DATA:  Fall. Known femoral neck fracture. Tenderness over the mid femur. EXAM: LEFT FEMUR 2 VIEWS COMPARISON:  Hip and knee series earlier today FINDINGS: Left femoral neck fracture again noted with varus angulation. No subluxation or dislocation. No additional acute femoral abnormality. No joint effusion within the left knee. IMPRESSION: Left femoral neck fracture with varus  angulation. Electronically Signed   By: Charlett Nose M.D.   On: 12/02/2019 19:56        Scheduled Meds: . acetaminophen  500 mg Oral Q8H  . Chlorhexidine Gluconate Cloth  6 each Topical Daily  . docusate sodium  100 mg Oral BID  . enoxaparin (LOVENOX) injection  40 mg Subcutaneous Q24H  . senna  1 tablet Oral BID   Continuous Infusions: . lactated ringers 10 mL/hr at 12/03/19 1454  . methocarbamol (ROBAXIN) IV            Glade Lloyd, MD [facility] 12/04/2019, 10:51 AM

## 2019-12-04 NOTE — Progress Notes (Signed)
Orthopaedic Trauma Service Progress Note  Patient ID: Joyce Perry MRN: [id number] DOB/AGE: Sep 21, 1945 74 y.o.  Subjective:  Eating breakfast No complaints    ROS No CP or SOB No N/V No dizziness No lightheadedness   Objective:   VITALS:   Vitals:   12/03/19 1934 12/03/19 2348 12/04/19 0401 12/04/19 0737  BP: 110/67 112/77 102/68 100/60  Pulse: 67 74 67 64  Resp: 15 16 15 14   Temp: 98 F (36.7 C) 98 F (36.7 C) 98.4 F (36.9 C) 98.2 F (36.8 C)  TempSrc: Oral Oral Oral Oral  SpO2: 99% 96% 98% 96%  Weight:      Height:        Estimated body mass index is 18.75 kg/m as calculated from the following:   Height as of this encounter: 5\' 3"  (1.6 m).   Weight as of this encounter: 48 kg.   Intake/Output      04/06 0701 - 04/07 0700 04/07 0701 - 04/08 0700   P.O. 222    I.V. (mL/kg) 1039.5 (21.7)    IV Piggyback 200    Total Intake(mL/kg) 1461.5 (30.4)    Urine (mL/kg/hr) 1700 (1.5)    Blood 50    Total Output 1750    Net -288.5           LABS  Results for orders placed or performed during the hospital encounter of 12/02/19 (from the past 24 hour(s))  Glucose, capillary     Status: None   Collection Time: 12/03/19  9:17 AM  Result Value Ref Range   Glucose-Capillary 94 70 - 99 mg/dL  Glucose, capillary     Status: Abnormal   Collection Time: 12/03/19 11:17 AM  Result Value Ref Range   Glucose-Capillary 101 (H) 70 - 99 mg/dL  Glucose, capillary     Status: Abnormal   Collection Time: 12/03/19  2:08 PM  Result Value Ref Range   Glucose-Capillary 116 (H) 70 - 99 mg/dL  CBC with Differential/Platelet     Status: Abnormal   Collection Time: 12/04/19  1:47 AM  Result Value Ref Range   WBC 7.6 4.0 - 10.5 K/uL   RBC 3.75 (L) 3.87 - 5.11 MIL/uL   Hemoglobin 11.7 (L) 12.0 - 15.0 g/dL   HCT 35.2 (L) 36.0 - 46.0 %   MCV 93.9 80.0 - 100.0 fL   MCH 31.2 26.0 - 34.0 pg   MCHC 33.2  30.0 - 36.0 g/dL   RDW 12.8 11.5 - 15.5 %   Platelets 189 150 - 400 K/uL   nRBC 0.0 0.0 - 0.2 %   Neutrophils Relative % 78 %   Neutro Abs 5.9 1.7 - 7.7 K/uL   Lymphocytes Relative 10 %   Lymphs Abs 0.8 0.7 - 4.0 K/uL   Monocytes Relative 11 %   Monocytes Absolute 0.8 0.1 - 1.0 K/uL   Eosinophils Relative 0 %   Eosinophils Absolute 0.0 0.0 - 0.5 K/uL   Basophils Relative 0 %   Basophils Absolute 0.0 0.0 - 0.1 K/uL   Immature Granulocytes 1 %   Abs Immature Granulocytes 0.04 0.00 - 0.07 K/uL  Basic metabolic panel     Status: Abnormal   Collection Time: 12/04/19  1:47 AM  Result Value Ref Range   Sodium 134 (L) 135 - 145 mmol/L  Potassium 3.7 3.5 - 5.1 mmol/L   Chloride 97 (L) 98 - 111 mmol/L   CO2 25 22 - 32 mmol/L   Glucose, Bld 181 (H) 70 - 99 mg/dL   BUN 11 8 - 23 mg/dL   Creatinine, Ser 6.43 0.44 - 1.00 mg/dL   Calcium 8.7 (L) 8.9 - 10.3 mg/dL   GFR calc non Af Amer >60 >60 mL/min   GFR calc Af Amer >60 >60 mL/min   Anion gap 12 5 - 15     PHYSICAL EXAM:   Gen: sitting up in bed, eating, confused but pleasantly so Lungs: unlabored, clear anterior fields Cardiac: regular Abd: + BS, NT Ext:        Left Lower Extremity   Dressing L hip with some strike through but overall stable  Minimal swelling L leg  Distal motor and sensory functions intact  No DCT   Compartments are soft   No pain with passive stretch   No knee, lower leg or ankle pain   Assessment/Plan: 1 Day Post-Op   Active Problems:   Chronic idiopathic constipation   History of cerebellar stroke   Controlled type 2 diabetes mellitus without complication, without long-term current use of insulin (HCC)   Fracture of femoral neck, left (HCC)   Vascular dementia (HCC)   DM (diabetes mellitus), type 2 (HCC)   Fracture of left hip due to osteoporosis (HCC)   Anti-infectives (From admission, onward)   Start     Dose/Rate Route Frequency Ordered Stop   12/03/19 1800  ceFAZolin (ANCEF) IVPB 2g/100 mL  premix     2 g 200 mL/hr over 30 Minutes Intravenous Every 6 hours 12/03/19 1431 12/04/19 0614   12/03/19 1030  ceFAZolin (ANCEF) IVPB 2g/100 mL premix     2 g 200 mL/hr over 30 Minutes Intravenous  Once 12/03/19 0914 12/03/19 1240    .  POD/HD#: 1  74 y/o female s/p ground level fall with Left femoral neck fracture, vascular dementia    -ground level fall with fragility fracture L femoral neck    - L femoral neck fracture s/p Left hip hemiarthroplasty              WBAT Left leg with assistance  Posterior hip precautions left hip    Knee immobilizer can be off when working with therapy   PT/OT  Dressing changes as needed starting tomorrow  Ice PRN swelling and pain    - Pain management:             Scheduled tylenol             Minimize opioids    - ABL anemia/Hemodynamics             Stable               CBC in am    - Medical issues              Per primary    - DVT/PE prophylaxis:             Lovenox x 28 days post op    - ID:              periop abx   - Metabolic Bone Disease:             Vitamin d levels look good              Fracture suggestive of osteoporosis  Recommend DEXA in 4-8 weeks to quantify              Would likely benefit from pharmacologic management of osteoporosis to prevent secondary fracture             Would also recommend aggressive therapy for strengthening and conditioning which will also help improve bone quality and improve balance    - Activity:             WBAT post op with walker              Therapy evals post op   - FEN/GI prophylaxis/Foley/Lines:             reg diet              - Impediments to fracture healing:             Osteoporosis             Cognitive deficits    - Dispo:             therapy evals  Will likely need snf   TOC consult for SNF      Mearl Latin, PA-C [phone] (C) 12/04/2019, 8:53 AM  [facility] [address] [place] [address] [phone] Collier Bullock (F)

## 2019-12-04 NOTE — [place] FL2 (Addendum)
MEDICAID FL2 LEVEL OF CARE SCREENING TOOL     IDENTIFICATION  Patient Name: Joyce Perry Birthdate: 1945/10/29 Sex: female Admission Date (Current Location): 12/02/2019  [facility] and [place] Number:  Producer, television/film/video and Address:  [facility], [address] [place], [place] [address]      Provider Number: [id number]  Attending Physician Name and Address:  Glade Lloyd, MD  Relative Name and Phone Number:       Current Level of Care: Hospital Recommended Level of Care: Skilled Nursing Facility Prior Approval Number:    Date Approved/Denied:   PASRR Number:   [id number]  Discharge Plan: SNF    Current Diagnoses: Patient Active Problem List   Diagnosis Date Noted  . Fracture of left hip due to osteoporosis (HCC) 12/03/2019  . Fracture of femoral neck, left (HCC) 12/02/2019  . Vascular dementia (HCC) 12/02/2019  . DM (diabetes mellitus), type 2 (HCC) 12/02/2019  . Vascular dementia without behavioral disturbance (HCC) 10/24/2019  . Chronic idiopathic constipation 09/23/2019  . History of cerebellar stroke 09/23/2019  . Memory loss 09/23/2019  . Controlled type 2 diabetes mellitus without complication, without long-term current use of insulin (HCC) 09/23/2019  . Foot pain, bilateral 09/23/2019    Orientation RESPIRATION BLADDER Height & Weight     Self, Time, Situation, Place  Normal Continent Weight: 105 lb 13.1 oz (48 kg) Height:  5\' 3"  (160 cm)  BEHAVIORAL SYMPTOMS/MOOD NEUROLOGICAL BOWEL NUTRITION STATUS      Continent Diet(see discharge summary)  AMBULATORY STATUS COMMUNICATION OF NEEDS Skin   Extensive Assist Verbally Surgical wounds, Skin abrasions(closed incision on left hip w/ foam dressing; skin tear on left elbow)                       Personal Care Assistance Level of Assistance  Bathing, Dressing, Feeding Bathing Assistance: Maximum assistance Feeding assistance: Independent Dressing Assistance:  Maximum assistance     Functional Limitations Info  Sight, Hearing, Speech Sight Info: Adequate Hearing Info: Adequate Speech Info: Adequate    SPECIAL CARE FACTORS FREQUENCY  PT (By licensed PT), OT (By licensed OT)     PT Frequency: 5x week OT Frequency: 5x week            Contractures Contractures Info: Not present    Additional Factors Info  Code Status, Allergies Code Status Info: Full Code Allergies Info: No Known Allergies           Current Medications (12/04/2019):  This is the current hospital active medication list Current Facility-Administered Medications  Medication Dose Route Frequency Provider Last Rate Last Admin  . acetaminophen (TYLENOL) tablet 325-650 mg  325-650 mg Oral Q6H PRN 02/03/2020, PA-C      . acetaminophen (TYLENOL) tablet 500 mg  500 mg Oral Q8H Montez Morita, PA-C   500 mg at 12/04/19 1304  . Chlorhexidine Gluconate Cloth 2 % PADS 6 each  6 each Topical Daily 02/03/20, MD   6 each at 12/03/19 2124  . docusate sodium (COLACE) capsule 100 mg  100 mg Oral BID 2125, PA-C   100 mg at 12/04/19 0931  . enoxaparin (LOVENOX) injection 40 mg  40 mg Subcutaneous Q24H 02/03/20, PA-C   40 mg at 12/04/19 02/03/20  . HYDROcodone-acetaminophen (NORCO/VICODIN) 5-325 MG per tablet 1-2 tablet  1-2 tablet Oral Q4H PRN 2836, PA-C      . lactated ringers infusion   Intravenous Continuous Montez Morita,  Lanny Hurst, PA-C 10 mL/hr at 12/03/19 1454 New Bag at 12/03/19 1454  . menthol-cetylpyridinium (CEPACOL) lozenge 3 mg  1 lozenge Oral PRN Ainsley Spinner, PA-C       Or  . phenol (CHLORASEPTIC) mouth spray 1 spray  1 spray Mouth/Throat PRN Ainsley Spinner, PA-C      . methocarbamol (ROBAXIN) tablet 500 mg  500 mg Oral Q6H PRN Ainsley Spinner, PA-C   500 mg at 12/03/19 2124   Or  . methocarbamol (ROBAXIN) 500 mg in dextrose 5 % 50 mL IVPB  500 mg Intravenous Q6H PRN Ainsley Spinner, PA-C      . metoCLOPramide (REGLAN) tablet 5-10 mg  5-10 mg Oral Q8H PRN Ainsley Spinner, PA-C        Or  . metoCLOPramide (REGLAN) injection 5-10 mg  5-10 mg Intravenous Q8H PRN Ainsley Spinner, PA-C      . morphine 2 MG/ML injection 0.5-1 mg  0.5-1 mg Intravenous Q2H PRN Ainsley Spinner, PA-C      . mupirocin ointment (BACTROBAN) 2 % 1 application  1 application Nasal BID Aline August, MD   1 application at 25/36/64 1258  . ondansetron (ZOFRAN) tablet 4 mg  4 mg Oral Q6H PRN Ainsley Spinner, PA-C       Or  . ondansetron [facility]) injection 4 mg  4 mg Intravenous Q6H PRN Ainsley Spinner, PA-C      . polyethylene glycol (MIRALAX / GLYCOLAX) packet 17 g  17 g Oral Daily PRN Ainsley Spinner, PA-C      . senna (SENOKOT) tablet 8.6 mg  1 tablet Oral BID Ainsley Spinner, PA-C   8.6 mg at 12/04/19 4034   Facility-Administered Medications Ordered in Other Encounters  Medication Dose Route Frequency Provider Last Rate Last Admin  . dexamethasone (DECADRON) injection   Intravenous Anesthesia Intra-op Mariea Clonts, CRNA   8 mg at 12/03/19 1229  . ePHEDrine injection   Intravenous Anesthesia Intra-op Mariea Clonts, CRNA   5 mg at 12/03/19 1232  . fentaNYL (SUBLIMAZE) injection   Intravenous Anesthesia Intra-op Mariea Clonts, CRNA   50 mcg at 12/03/19 1300  . glycopyrrolate (ROBINUL) injection   Intravenous Anesthesia Intra-op Mariea Clonts, CRNA   0.2 mg at 12/03/19 1236  . lactated ringers infusion   Intravenous Continuous PRN Mariea Clonts, CRNA   New Bag at 12/03/19 1349  . lidocaine 2% (20 mg/mL) 5 mL syringe   Intravenous Anesthesia Intra-op Mariea Clonts, CRNA   30 mg at 12/03/19 1159  . ondansetron (ZOFRAN) injection   Intravenous Anesthesia Intra-op Mariea Clonts, CRNA   4 mg at 12/03/19 1324  . phenylephrine (NEOSYNEPHRINE) 10-0.9 MG/250ML-% infusion   Intravenous Continuous PRN Mariea Clonts, CRNA   Stopped at 12/03/19 1350  . propofol (DIPRIVAN) 10 mg/mL bolus/IV push   Intravenous Anesthesia Intra-op Mariea Clonts, CRNA   130 mg at 12/03/19 1159  . rocuronium bromide 10  mg/mL (PF) syringe (61mL)   Intravenous Anesthesia Intra-op Mariea Clonts, CRNA   20 mg at 12/03/19 1224  . sugammadex sodium (BRIDION) injection   Intravenous Anesthesia Intra-op Mariea Clonts, CRNA   180 mg at 12/03/19 1349     Discharge Medications: Please see discharge summary for a list of discharge medications.  Relevant Imaging Results:  Relevant Lab Results:   Additional Information SS#[id number] [place], [place]

## 2019-12-04 NOTE — Evaluation (Signed)
Occupational Therapy Evaluation Patient Details Name: Joyce Perry MRN: [id number] DOB: 1945-10-11 Today's Date: 12/04/2019    History of Present Illness Pt is a 74 y/o female who presents to PT L femoral neck fracture s/p Left hip hemiarthroplasty (12/03/19). Pt is WBAT. Her past medical history is significant for vascular dementia, CVA, DM 2, & Osteoporosis. PTA patient was able to walk a flight of stairs or to mailbox with no AD.   Clinical Impression   PTA, pt was living at home with her husband, and was independent with ADL/IADL and functional mobility and pt was assisting her husband with ADL/IADL. Pt currently requires modA to progress into standing from recliner and minA for functional mobility in room. Pt required minA for stability standing at sink level to complete grooming task.. Due to decline in current level of function, pt would benefit from acute OT to address established goals to facilitate safe D/C to venue listed below. At this time, recommend SNF follow-up. Will continue to follow acutely.   Follow Up Recommendations  SNF    Equipment Recommendations  Toilet rise with handles    Recommendations for Other Services       Precautions / Restrictions Precautions Precautions: Posterior Hip Precaution Booklet Issued: Yes (comment) Precaution Comments: Pt verbalized understanding Required Braces or Orthoses: Knee Immobilizer - Left Restrictions Weight Bearing Restrictions: Yes LLE Weight Bearing: Weight bearing as tolerated Other Position/Activity Restrictions: Posterior hip precautions      Mobility Bed Mobility Overal bed mobility: Needs Assistance Bed Mobility: Supine to Sit     Supine to sit: Mod assist;HOB elevated     General bed mobility comments: pt sitting in recliner upon arrival  Transfers Overall transfer level: Needs assistance Equipment used: Rolling walker (2 wheeled) Transfers: Sit to/from Stand Sit to Stand: Mod assist         General  transfer comment: modA to powerup from recliner cues for safe hand placement, position of LLE and to progress into full upright posture    Balance Overall balance assessment: Needs assistance Sitting-balance support: Feet supported;Bilateral upper extremity supported Sitting balance-Leahy Scale: Fair   Postural control: Posterior lean Standing balance support: Bilateral upper extremity supported Standing balance-Leahy Scale: Fair Standing balance comment: Pt could wash hands without UE support and maintain balance without LOB                           ADL either performed or assessed with clinical judgement   ADL Overall ADL's : Needs assistance/impaired Eating/Feeding: Set up;Sitting   Grooming: Minimal assistance;Standing   Upper Body Bathing: Minimal assistance;Sitting   Lower Body Bathing: Moderate assistance;Sit to/from stand   Upper Body Dressing : Minimal assistance;Sitting   Lower Body Dressing: Moderate assistance;Sit to/from stand   Toilet Transfer: Minimal assistance;RW;Ambulation Armed forces technical officer Details (indicate cue type and reason): increased time to ambulate to bathroom Toileting- Clothing Manipulation and Hygiene: Moderate assistance;Sit to/from stand Toileting - Clothing Manipulation Details (indicate cue type and reason): modA to powerup into standing     Functional mobility during ADLs: Minimal assistance;Rolling walker General ADL Comments: pt limited by pain, decreased activity tilerance, and cognitive limitations     Vision Baseline Vision/History: Wears glasses Wears Glasses: At all times Patient Visual Report: No change from baseline       Perception     Praxis      Pertinent Vitals/Pain Pain Assessment: Faces Faces Pain Scale: Hurts little more Pain Location: L upper femur Pain  Descriptors / Indicators: Aching;Grimacing;Guarding;Operative site guarding Pain Intervention(s): Limited activity within patient's tolerance;Monitored  during session     Hand Dominance Right   Extremity/Trunk Assessment Upper Extremity Assessment Upper Extremity Assessment: Generalized weakness   Lower Extremity Assessment Lower Extremity Assessment: Generalized weakness LLE Deficits / Details: Pt LLE in straight knee brace. Pt able to stand with minimal difficulty/pain. Has tendency to roll leg inward, seemed to understand precautions and taught back at end of session with 4-5 explinations during session required for retention and visuals. Handout given LLE: Unable to fully assess due to immobilization LLE Sensation: WNL   Cervical / Trunk Assessment Cervical / Trunk Assessment: Kyphotic   Communication Communication Communication: Other (comment)(Pt has STM difficulties requiring multiple explinations)   Cognition Arousal/Alertness: Awake/alert Behavior During Therapy: WFL for tasks assessed/performed Overall Cognitive Status: No family/caregiver present to determine baseline cognitive functioning                                 General Comments: Pt demonstrated memory deficits (requiring multiple explinations for suggested care), and multiple cues for sequencing. Able to follow simple and multistep commands inconsistently.   General Comments  VSS throughout    Exercises Exercises: Other exercises;General Lower Extremity General Exercises - Lower Extremity Ankle Circles/Pumps: AROM;Both;10 reps;Seated Quad Sets: Strengthening;Both;10 reps;Supine Gluteal Sets: Strengthening;Both;10 reps;Supine Hip ABduction/ADduction: AAROM;Left;10 reps;Supine Toe Raises: Strengthening;Both;10 reps;Standing Other Exercises Other Exercises: Incentive spirometer - pull x5 Other Exercises: Deep breathing for pain x 10 breaths   Shoulder Instructions      Home Living Family/patient expects to be discharged to:: Private residence Living Arrangements: Spouse/significant other Available Help at Discharge:  Family;Available PRN/intermittently Type of Home: House Home Access: Stairs to enter [employer] of Steps: 7 Entrance Stairs-Rails: Right;Left Home Layout: Multi-level;Able to live on main level with bedroom/bathroom(sleeps upstairs)     Bathroom Shower/Tub: Chief Strategy Officer: Handicapped height Bathroom Accessibility: Yes   Home Equipment: Hospital bed(downstairs)   Additional Comments: Pt states living in a 2 story house with 97foot front poarch, 7 steps,       Prior Functioning/Environment Level of Independence: Independent        Comments: Takes care of WC bound husband        OT Problem List: Decreased strength;Decreased range of motion;Decreased activity tolerance;Impaired balance (sitting and/or standing);Decreased safety awareness;Decreased knowledge of use of DME or AE;Decreased cognition;Decreased knowledge of precautions;Pain      OT Treatment/Interventions: Self-care/ADL training;Therapeutic exercise;DME and/or AE instruction;Therapeutic activities;Cognitive remediation/compensation;Patient/family education;Balance training    OT Goals(Current goals can be found in the care plan section) Acute Rehab OT Goals Patient Stated Goal: Get stronger to eventually go home OT Goal Formulation: With patient Time For Goal Achievement: 12/18/19 Potential to Achieve Goals: Good ADL Goals Pt Will Perform Grooming: with modified independence;sitting Pt Will Perform Upper Body Dressing: with set-up;sitting Pt Will Perform Lower Body Dressing: with supervision;sit to/from stand Pt Will Transfer to Toilet: with supervision;ambulating  OT Frequency: Min 2X/week   Barriers to D/C: Decreased caregiver support  husband is unable to provide physical assistance       Co-evaluation              AM-PAC OT "6 Clicks" Daily Activity     Outcome Measure Help from another person eating meals?: A Little Help from another person taking care of  personal grooming?: A Little Help from another person toileting, which includes using toliet,  bedpan, or urinal?: A Little Help from another person bathing (including washing, rinsing, drying)?: A Little Help from another person to put on and taking off regular upper body clothing?: A Little Help from another person to put on and taking off regular lower body clothing?: A Lot 6 Click Score: 17   End of Session Equipment Utilized During Treatment: Gait belt;Rolling walker;Left knee immobilizer Nurse Communication: Mobility status  Activity Tolerance: Patient tolerated treatment well Patient left: in chair;with call bell/phone within reach;with chair alarm set  OT Visit Diagnosis: Unsteadiness on feet (R26.81);Other abnormalities of gait and mobility (R26.89);Muscle weakness (generalized) (M62.81);Other symptoms and signs involving cognitive function;Pain Pain - Right/Left: Left Pain - part of body: Leg;Hip                Time: 1225-1258 OT Time Calculation (min): 33 min Charges:  OT General Charges $OT Visit: 1 Visit OT Evaluation $OT Eval Moderate Complexity: 1 Mod OT Treatments $Self Care/Home Management : 8-22 mins  Rosey Bath OTR/L Acute Rehabilitation Services Office: [phone]   Rebeca Alert 12/04/2019, 1:31 PM

## 2019-12-04 NOTE — Plan of Care (Signed)
  Problem: Education: Goal: Knowledge of General Education information will improve Description: Including pain rating scale, medication(s)/side effects and non-pharmacologic comfort measures Outcome: Progressing   Problem: Clinical Measurements: Goal: Respiratory complications will improve Outcome: Progressing Note: On room air   Problem: Activity: Goal: Risk for activity intolerance will decrease Outcome: Progressing Note: Up to bathroom with 1 assist/walker tolerated well   Problem: Nutrition: Goal: Adequate nutrition will be maintained Outcome: Progressing   Problem: Coping: Goal: Level of anxiety will decrease Outcome: Progressing   Problem: Pain Managment: Goal: General experience of comfort will improve Outcome: Progressing Note: No complaints of pain this shift   Problem: Safety: Goal: Ability to remain free from injury will improve Outcome: Progressing

## 2019-12-04 NOTE — TOC Progression Note (Signed)
Transition of Care [facility]) - Progression Note    Patient Details  Name: [name] MRN: [id number] Date of Birth: Jan 17, 1946  [facility] of [facility] CM/SW Contact  Janae Bridgeman, RN Phone Number: 12/04/2019, 2:47 PM  Clinical Narrative:     Case management spoke with the son and the son was given Medicare Choice regarding SNF placement in [place], [place], which is where the son is moving the patient and her husband after disposition from the hospital.  The son's preference for placement include 1. [facility], 2. [facility], 3. [facility].  Clinicals faxed to April at [facility] at fax # [phone].  Waiting for call back from Prince Solian with [facility] community at [phone].       Expected Discharge Plan and Services                                                 Social Determinants of Health (SDOH) Interventions    Readmission Risk Interventions No flowsheet data found.

## 2019-12-04 NOTE — Evaluation (Signed)
Physical Therapy Evaluation Patient Details Name: Joyce Perry MRN: [id number] DOB: 02-01-1946 Today's Date: 12/04/2019   History of Present Illness  Pt is a 74 y/o female who presents to PT L femoral neck fracture s/p Left hip hemiarthroplasty (12/03/19). Pt is WBAT. Her past medical history is significant for vascular dementia, CVA, DM 2, & Osteoporosis. PTA patient was able to walk a flight of stairs or to mailbox with no AD.  Clinical Impression  PTA patient was able to ambulate independently to mailbox and back and up a flight of stairs as well as assisting her husband with ADLs & IADLs. Pt is AOx3 with decreased knowledge of location. She seems to have periods of lucidness and confusion as she was oriented fully at start of session but then randomly asked if she was home 30 min into the session. Pt very fearful of moving due to falling & pain limitations and requires 24/7 assistance/supervision OOB for continued functional mobility. Due to current care limitations, current functional level, and PLOF required to assist husband currently, recommend SNF for skilled rehab at this time. PT will continue following patient acutely to address below listed deficits until transition to next level of care.    Follow Up Recommendations SNF;Supervision/Assistance - 24 hour;Supervision for mobility/OOB    Equipment Recommendations  Other (comment)(TBD at next venue of care)    Recommendations for Other Services OT consult     Precautions / Restrictions Precautions Precautions: Posterior Hip Precaution Booklet Issued: Yes (comment) Precaution Comments: Pt verbalized understanding Required Braces or Orthoses: Knee Immobilizer - Left Restrictions Weight Bearing Restrictions: Yes LLE Weight Bearing: Weight bearing as tolerated Other Position/Activity Restrictions: Posterior hip precautions      Mobility  Bed Mobility Overal bed mobility: Needs Assistance Bed Mobility: Supine to Sit      Supine to sit: Mod assist;HOB elevated     General bed mobility comments: modA for trunk management and scoot secondary to patient fear of pain.   Transfers Overall transfer level: Needs assistance Equipment used: Rolling walker (2 wheeled) Transfers: Sit to/from Stand Sit to Stand: Min assist;From elevated surface         General transfer comment: Pt able to perform when she got over hear fear of pain occuring with movement.   Ambulation/Gait Ambulation/Gait assistance: Min guard Gait Distance (Feet): 15 Feet(x2) Assistive device: Rolling walker (2 wheeled) Gait Pattern/deviations: Step-through pattern;Decreased stride length;Decreased stance time - left;Antalgic Gait velocity: decreased   General Gait Details: Pt required intermittent VCs for sequencing and foward progression of the walker.     Balance Overall balance assessment: Needs assistance Sitting-balance support: Feet supported;Bilateral upper extremity supported Sitting balance-Leahy Scale: Fair   Postural control: Posterior lean Standing balance support: Bilateral upper extremity supported Standing balance-Leahy Scale: Fair Standing balance comment: Pt could wash hands without UE support and maintain balance without LOB        Pertinent Vitals/Pain Pain Assessment: Faces Faces Pain Scale: Hurts little more Pain Location: L upper femur Pain Descriptors / Indicators: Aching;Grimacing;Guarding;Operative site guarding Pain Intervention(s): Monitored during session;Repositioned;Limited activity within patient's tolerance    Home Living Family/patient expects to be discharged to:: Private residence Living Arrangements: Spouse/significant other Available Help at Discharge: Family;Available PRN/intermittently Type of Home: House Home Access: Stairs to enter Entrance Stairs-Rails: Right;Left Entrance Stairs-Number of Steps: 7 Home Layout: Multi-level;Able to live on main level with bedroom/bathroom(sleeps  upstairs) Home Equipment: Hospital bed(downstairs) Additional Comments: Pt states living in a 2 story house with 39foot front poarch, 7 steps,  Prior Function Level of Independence: Independent      Comments: Takes care of WC bound husband     Hand Dominance   Dominant Hand: Right    Extremity/Trunk Assessment   Upper Extremity Assessment Upper Extremity Assessment: Defer to OT evaluation    Lower Extremity Assessment Lower Extremity Assessment: Generalized weakness;LLE deficits/detail LLE Deficits / Details: Pt LLE in straight knee brace. Pt able to stand with minimal difficulty/pain. Has tendency to roll leg inward, seemed to understand precautions and taught back at end of session with 4-5 explinations during session required for retention and visuals. Handout given LLE: Unable to fully assess due to immobilization LLE Sensation: WNL       Communication   Communication: Other (comment)(Pt has STM difficulties requiring multiple explinations)  Cognition Arousal/Alertness: Awake/alert Behavior During Therapy: WFL for tasks assessed/performed Overall Cognitive Status: No family/caregiver present to determine baseline cognitive functioning          General Comments: Pt demonstrated memory deficits (requiring multiple explinations for suggested care), and multiple cues for sequencing. Able to follow simple and multistep commands inconsistently.      General Comments General comments (skin integrity, edema, etc.): VSS throughout tx    Exercises General Exercises - Lower Extremity Ankle Circles/Pumps: AROM;Both;10 reps;Seated Quad Sets: Strengthening;Both;10 reps;Supine Gluteal Sets: Strengthening;Both;10 reps;Supine Hip ABduction/ADduction: AAROM;Left;10 reps;Supine Toe Raises: Strengthening;Both;10 reps;Standing Other Exercises Other Exercises: Incentive spirometer - pull x5 Other Exercises: Deep breathing for pain x 10 breaths   Assessment/Plan    PT  Assessment Patient needs continued PT services  PT Problem List Decreased strength;Decreased range of motion;Decreased activity tolerance;Decreased balance;Decreased mobility;Decreased cognition;Decreased safety awareness;Decreased knowledge of precautions;Impaired sensation;Pain       PT Treatment Interventions DME instruction;Gait training;Functional mobility training;Stair training;Therapeutic activities;Therapeutic exercise;Balance training;Neuromuscular re-education;Cognitive remediation;Patient/family education;Modalities;Manual techniques    PT Goals (Current goals can be found in the Care Plan section)  Acute Rehab PT Goals Patient Stated Goal: Get stronger to eventually go home PT Goal Formulation: With patient Time For Goal Achievement: 12/18/19 Potential to Achieve Goals: Good    Frequency Min 3X/week   Barriers to discharge Decreased caregiver support;Inaccessible home environment Pt takes care of WC bound husband, currently very unsteady and high fear of falling and pain limiting mobility progression       AM-PAC PT "6 Clicks" Mobility  Outcome Measure Help needed turning from your back to your side while in a flat bed without using bedrails?: A Little Help needed moving from lying on your back to sitting on the side of a flat bed without using bedrails?: A Lot Help needed moving to and from a bed to a chair (including a wheelchair)?: A Lot Help needed standing up from a chair using your arms (e.g., wheelchair or bedside chair)?: A Little Help needed to walk in hospital room?: A Little Help needed climbing 3-5 steps with a railing? : Total 6 Click Score: 14    End of Session Equipment Utilized During Treatment: Gait belt Activity Tolerance: Patient tolerated treatment well Patient left: in chair;with call bell/phone within reach;with chair alarm set;with SCD's reapplied Nurse Communication: Mobility status PT Visit Diagnosis: Unsteadiness on feet (R26.81);Muscle  weakness (generalized) (M62.81);History of falling (Z91.81);Difficulty in walking, not elsewhere classified (R26.2);Pain;Other (comment)(post op status) Pain - Right/Left: Left Pain - part of body: Hip    Time: 9381-0175 PT Time Calculation (min) (ACUTE ONLY): 55 min   Charges:   PT Evaluation $PT Eval Moderate Complexity: 1 Mod PT Treatments $Therapeutic Exercise: 8-22 mins $  Therapeutic Activity: 8-22 mins        Oda Cogan PT, DPT Acute Rehab [facility] P: [phone]   Vilma Prader A Jilda Kress 12/04/2019, 1:05 PM

## 2019-12-05 ENCOUNTER — Encounter: Payer: Self-pay | Admitting: Anesthesiology

## 2019-12-05 LAB — CBC
HCT: 31.2 % — ABNORMAL LOW (ref 36.0–46.0)
Hemoglobin: 10.3 g/dL — ABNORMAL LOW (ref 12.0–15.0)
MCH: 31 pg (ref 26.0–34.0)
MCHC: 33 g/dL (ref 30.0–36.0)
MCV: 94 fL (ref 80.0–100.0)
Platelets: 163 10*3/uL (ref 150–400)
RBC: 3.32 MIL/uL — ABNORMAL LOW (ref 3.87–5.11)
RDW: 13 % (ref 11.5–15.5)
WBC: 7.5 10*3/uL (ref 4.0–10.5)
nRBC: 0 % (ref 0.0–0.2)

## 2019-12-05 MED ORDER — LORAZEPAM 2 MG/ML IJ SOLN: 0.5000 mg | Freq: Once | INTRAMUSCULAR | Status: DC

## 2019-12-05 MED ORDER — LORAZEPAM 1 MG PO TABS
1.0000 mg | ORAL_TABLET | Freq: Every day | ORAL | Status: AC
  Administered 2019-12-05: 1 mg via ORAL
  Filled 2019-12-05: qty 1

## 2019-12-05 NOTE — TOC Transition Note (Signed)
Transition of Care [facility]) - CM/SW Discharge Note   Patient Details  Name: Joyce Perry MRN: [id number] Date of Birth: February 14, 1946  Transition of Care [facility]) CM/SW Contact:  Janae Bridgeman, RN Phone Number: 12/05/2019, 2:17 PM   Clinical Narrative:     Case management spoke with the son, Nicholes Rough, on the phone and offered that [facility] offered the patient a SNF bed in [place], [place] - son in agreement to transfer patient to this facility once insurance auth approved.  Called [employer] to provide information for insurance authorization - Ref # [id number] provided.   Patient Goals and CMS Choice        Discharge Placement                       Discharge Plan and Services                                     Social Determinants of Health (SDOH) Interventions     Readmission Risk Interventions No flowsheet data found.

## 2019-12-05 NOTE — Care Management (Signed)
Case management faxed clinicals to [facility] in [place], [place] to find placement in short-term SNF that accepts patient's insurance.  Called Vilinda Flake at [facility] and faxed info to [phone].  Also, left message with Prince Solian with [facility] at [phone].  Will followup.

## 2019-12-05 NOTE — Progress Notes (Signed)
PROGRESS NOTE    Joyce Perry  OFB:[id number] DOB: 1946-05-31 DOA: 12/02/2019 PCP: Kermit Balo, DO     Brief Narrative:  Joyce Perry is a 74 year old female with history of vascular dementia, CVA, diabetes mellitus type 2 presented with left hip pain after fall at home.  She was found to have left femoral neck fracture for which she underwent operative intervention on 12/03/2019 by orthopedic surgery.   New events last 24 hours / Subjective: No complaints voiced this morning.  Remains pleasantly confused.  Assessment & Plan:   Principal Problem:   Fracture of femoral neck, left (HCC) Active Problems:   Chronic idiopathic constipation   History of cerebellar stroke   Controlled type 2 diabetes mellitus without complication, without long-term current use of insulin (HCC)   Vascular dementia (HCC)   DM (diabetes mellitus), type 2 (HCC)   Fracture of left hip due to osteoporosis (HCC)   Left femoral neck fracture after a fall -Status post hemiarthroplasty of the left hip on 12/03/2019 by Dr. Carola Frost -SNF placement pending -Pain control  Vascular dementia -Stable, delirium precautions  Diabetes mellitus type 2, well controlled -Hemoglobin A1c 5.3.  She is not on any home medications   DVT prophylaxis: Lovenox Code Status: Full code Family Communication: None at bedside Disposition Plan:  . Patient is from home prior to admission. Marland Kitchen Barrier(s) to discharge include SNF placement pending.  . Suspect patient will discharge to SNF in 1 to 2 days   Consultants:   Orthopedic surgery  Procedures:   Hemiarthroplasty left hip by Dr. Carola Frost 4/6  Antimicrobials:  Anti-infectives (From admission, onward)   Start     Dose/Rate Route Frequency Ordered Stop   12/03/19 1800  ceFAZolin (ANCEF) IVPB 2g/100 mL premix     2 g 200 mL/hr over 30 Minutes Intravenous Every 6 hours 12/03/19 1431 12/04/19 0614   12/03/19 1030  ceFAZolin (ANCEF) IVPB 2g/100 mL premix     2 g 200  mL/hr over 30 Minutes Intravenous  Once 12/03/19 0914 12/03/19 1240        Objective: Vitals:   12/04/19 1314 12/04/19 1841 12/05/19 0422 12/05/19 0900  BP: 119/74 126/77 119/72 139/82  Pulse: 87 78 88 78  Resp: 16 14 16 17   Temp: 98.7 F (37.1 C) 98.4 F (36.9 C) 98.2 F (36.8 C) 98.4 F (36.9 C)  TempSrc: Oral Oral Oral Oral  SpO2: 99% 98% 98% 97%  Weight:      Height:        Intake/Output Summary (Last 24 hours) at 12/05/2019 1245 Last data filed at 12/04/2019 1421 Gross per 24 hour  Intake 270 ml  Output --  Net 270 ml   Filed Weights   12/02/19 1607  Weight: 48 kg    Examination:  General exam: Appears calm and comfortable  Respiratory system: Clear to auscultation. Respiratory effort normal. No respiratory distress. No conversational dyspnea.  Cardiovascular system: S1 & S2 heard, RRR. No murmurs. No pedal edema. Gastrointestinal system: Abdomen is nondistended, soft and nontender. Normal bowel sounds heard. Central nervous system: Alert Skin: No rashes, lesions or ulcers on exposed skin  Psychiatry: Dementia  Data Reviewed: I have personally reviewed following labs and imaging studies  CBC: Recent Labs  Lab 12/02/19 1601 12/03/19 0510 12/04/19 0147 12/05/19 0306  WBC 5.6 6.3 7.6 7.5  NEUTROABS  --   --  5.9  --   HGB 12.3 12.2 11.7* 10.3*  HCT 38.7 37.4 35.2* 31.2*  MCV  96.5 94.4 93.9 94.0  PLT 208 199 189 417   Basic Metabolic Panel: Recent Labs  Lab 12/02/19 1601 12/03/19 0510 12/04/19 0147  NA 140 135 134*  K 3.8 3.9 3.7  CL 103 102 97*  CO2 27 24 25   GLUCOSE 108* 111* 181*  BUN 15 14 11   CREATININE 0.77 0.65 0.88  CALCIUM 9.0 8.8* 8.7*   GFR: Estimated Creatinine Clearance: 41.2 mL/min (by C-G formula based on SCr of 0.88 mg/dL). Liver Function Tests: Recent Labs  Lab 12/02/19 1601  AST 27  ALT 17  ALKPHOS 89  BILITOT 0.5  PROT 6.8  ALBUMIN 3.8   No results for input(s): LIPASE, AMYLASE in the last 168 hours. No results  for input(s): AMMONIA in the last 168 hours. Coagulation Profile: No results for input(s): INR, PROTIME in the last 168 hours. Cardiac Enzymes: No results for input(s): CKTOTAL, CKMB, CKMBINDEX, TROPONINI in the last 168 hours. BNP (last 3 results) No results for input(s): PROBNP in the last 8760 hours. HbA1C: No results for input(s): HGBA1C in the last 72 hours. CBG: Recent Labs  Lab 12/02/19 1620 12/03/19 0917 12/03/19 1117 12/03/19 1408  GLUCAP 106* 94 101* 116*   Lipid Profile: No results for input(s): CHOL, HDL, LDLCALC, TRIG, CHOLHDL, LDLDIRECT in the last 72 hours. Thyroid Function Tests: No results for input(s): TSH, T4TOTAL, FREET4, T3FREE, THYROIDAB in the last 72 hours. Anemia Panel: No results for input(s): VITAMINB12, FOLATE, FERRITIN, TIBC, IRON, RETICCTPCT in the last 72 hours. Sepsis Labs: No results for input(s): PROCALCITON, LATICACIDVEN in the last 168 hours.  Recent Results (from the past 240 hour(s))  SARS CORONAVIRUS 2 (TAT 6-24 HRS) Nasopharyngeal Nasopharyngeal Swab     Status: None   Collection Time: 12/02/19  8:27 PM   Specimen: Nasopharyngeal Swab  Result Value Ref Range Status   SARS Coronavirus 2 NEGATIVE NEGATIVE Final    Comment: (NOTE) SARS-CoV-2 target nucleic acids are NOT DETECTED. The SARS-CoV-2 RNA is generally detectable in upper and lower respiratory specimens during the acute phase of infection. Negative results do not preclude SARS-CoV-2 infection, do not rule out co-infections with other pathogens, and should not be used as the sole basis for treatment or other patient management decisions. Negative results must be combined with clinical observations, patient history, and epidemiological information. The expected result is Negative. Fact Sheet for Patients: SugarRoll.be Fact Sheet for Healthcare Providers: https://www.woods-mathews.com/ This test is not yet approved or cleared by the  [place] FDA and  has been authorized for detection and/or diagnosis of SARS-CoV-2 by FDA under an Emergency Use Authorization (EUA). This EUA will remain  in effect (meaning this test can be used) for the duration of the COVID-19 declaration under Section 56 4(b)(1) of the Act, 21 U.S.C. section 360bbb-3(b)(1), unless the authorization is terminated or revoked sooner. Performed at [facility], [place] [address], [place], [place] [address]   Surgical pcr screen     Status: Abnormal   Collection Time: 12/03/19  8:10 AM   Specimen: Nasal Mucosa; Nasal Swab  Result Value Ref Range Status   MRSA, PCR NEGATIVE NEGATIVE Final   Staphylococcus aureus POSITIVE (A) NEGATIVE Final    Comment: (NOTE) The Xpert SA Assay (FDA approved for NASAL specimens in patients 63 years of age and older), is one component of a comprehensive surveillance program. It is not intended to diagnose infection nor to guide or monitor treatment. Performed at [facility], [place] [address], [place], [place] [address]  Anaerobic culture     Status: None (Preliminary result)   Collection Time: 12/03/19 12:41 PM   Specimen: Urine, Catheterized; Urinary  Result Value Ref Range Status   Specimen Description URINE, CATHETERIZED  Final   Special Requests   Final    NONE Performed at [facility], [address], [place], [place] [address]    Culture   Final    NO ANAEROBES ISOLATED; CULTURE IN PROGRESS FOR 5 DAYS   Report Status PENDING  Incomplete      Radiology Studies: DG Hip Port Unilat With Pelvis 1V Left  Result Date: 12/03/2019 CLINICAL DATA:  Total hip replacement for fracture EXAM: DG HIP (WITH OR WITHOUT PELVIS) 2V PORT LEFT COMPARISON:  Left hip radiographs December 02, 2019 FINDINGS: Frontal pelvis as well as lateral left hip images obtained. There is a total hip replacement on the left with prosthetic components well-seated. No fracture or dislocation. Air on the left is an expected  postoperative finding. Right hip joint appears unremarkable. IMPRESSION: Status post total hip replacement on the left with prosthetic components well-seated. No fracture or dislocation. Right hip joint unremarkable. Electronically Signed   By: Bretta Bang III M.D.   On: 12/03/2019 14:38      Scheduled Meds: . acetaminophen  500 mg Oral Q8H  . Chlorhexidine Gluconate Cloth  6 each Topical Daily  . docusate sodium  100 mg Oral BID  . enoxaparin (LOVENOX) injection  40 mg Subcutaneous Q24H  . mupirocin ointment  1 application Nasal BID  . senna  1 tablet Oral BID   Continuous Infusions: . lactated ringers 10 mL/hr at 12/03/19 1454  . methocarbamol (ROBAXIN) IV       LOS: 3 days      Time spent: 35 minutes   Noralee Stain, DO [facility] 12/05/2019, 12:45 PM   Available via Epic secure chat 7am-7pm After these hours, please refer to coverage provider listed on [email or web address]

## 2019-12-06 LAB — SARS CORONAVIRUS 2 (TAT 6-24 HRS): SARS Coronavirus 2: NEGATIVE

## 2019-12-06 MED ORDER — ACETAMINOPHEN 325 MG PO TABS: 325.0000 mg | ORAL_TABLET | Freq: Four times a day (QID) | ORAL | 0 refills | Status: AC | PRN

## 2019-12-06 MED ORDER — HYDROCODONE-ACETAMINOPHEN 5-325 MG PO TABS: 1.0000 | ORAL_TABLET | ORAL | 0 refills | Status: AC | PRN

## 2019-12-06 MED ORDER — ENOXAPARIN SODIUM 40 MG/0.4ML ~~LOC~~ SOLN: 40.0000 mg | SUBCUTANEOUS | 0 refills | Status: AC

## 2019-12-06 NOTE — Discharge Instructions (Signed)
Orthopaedic Trauma Service Discharge Instructions   General Discharge Instructions  Orthopaedic Injuries:  Left femoral neck fracture treated with left hip hemiarthroplasty   WEIGHT BEARING STATUS: weightbearing as tolerated with assistance  RANGE OF MOTION/ACTIVITY: posterior hip precautions left hip. Activity as tolerated otherwise   Bone health: vitamin d levels look good. Continue home regimen.  recommed bone density scan in 4-8 weeks   Wound Care: daily wound care starting on 12/08/2019. See below   Discharge Wound Care Instructions  Do NOT apply any ointments, solutions or lotions to pin sites or surgical wounds.  These prevent needed drainage and even though solutions like hydrogen peroxide kill bacteria, they also damage cells lining the pin sites that help fight infection.  Applying lotions or ointments can keep the wounds moist and can cause them to breakdown and open up as well. This can increase the risk for infection. When in doubt call the office.  Surgical incisions should be dressed daily starting 12/08/2019  If any drainage is noted, use one layer of adaptic, 4x4 gauze and tape. Alternatively you can use a mepilex type dressing   Once the incision is completely dry and without drainage, it may be left open to air out.  Showering may begin 36-48 hours later.  Cleaning gently with soap and water.  DVT/PE prophylaxis: Lovenox 40 mg subcutaneous injection daily x 28 days after discharge from hospital   Diet: as you were eating previously.  Can use over the counter stool softeners and bowel preparations, such as Miralax, to help with bowel movements.  Narcotics can be constipating.  Be sure to drink plenty of fluids  PAIN MEDICATION USE AND EXPECTATIONS  You have likely been given narcotic medications to help control your pain.  After a traumatic event that results in an fracture (broken bone) with or without surgery, it is ok to use narcotic pain medications to help  control one's pain.  We understand that everyone responds to pain differently and each individual patient will be evaluated on a regular basis for the continued need for narcotic medications. Ideally, narcotic medication use should last no more than 6-8 weeks (coinciding with fracture healing).   As a patient it is your responsibility as well to monitor narcotic medication use and report the amount and frequency you use these medications when you come to your office visit.   We would also advise that if you are using narcotic medications, you should take a dose prior to therapy to maximize you participation.  IF YOU ARE ON NARCOTIC MEDICATIONS IT IS NOT PERMISSIBLE TO OPERATE A MOTOR VEHICLE (MOTORCYCLE/CAR/TRUCK/MOPED) OR HEAVY MACHINERY DO NOT MIX NARCOTICS WITH OTHER CNS (CENTRAL NERVOUS SYSTEM) DEPRESSANTS SUCH AS ALCOHOL   STOP SMOKING OR USING NICOTINE PRODUCTS!!!!  As discussed nicotine severely impairs your body's ability to heal surgical and traumatic wounds but also impairs bone healing.  Wounds and bone heal by forming microscopic blood vessels (angiogenesis) and nicotine is a vasoconstrictor (essentially, shrinks blood vessels).  Therefore, if vasoconstriction occurs to these microscopic blood vessels they essentially disappear and are unable to deliver necessary nutrients to the healing tissue.  This is one modifiable factor that you can do to dramatically increase your chances of healing your injury.    (This means no smoking, no nicotine gum, patches, etc)  DO NOT USE NONSTEROIDAL ANTI-INFLAMMATORY DRUGS (NSAID'S)  Using products such as Advil (ibuprofen), Aleve (naproxen), Motrin (ibuprofen) for additional pain control during fracture healing can delay and/or prevent the healing response.  If you would like to take over the counter (OTC) medication, Tylenol (acetaminophen) is ok.  However, some narcotic medications that are given for pain control contain acetaminophen as well. Therefore,  you should not exceed more than 4000 mg of tylenol in a day if you do not have liver disease.  Also note that there are may OTC medicines, such as cold medicines and allergy medicines that my contain tylenol as well.  If you have any questions about medications and/or interactions please ask your doctor/PA or your pharmacist.      ICE AND ELEVATE INJURED/OPERATIVE EXTREMITY  Using ice and elevating the injured extremity above your heart can help with swelling and pain control.  Icing in a pulsatile fashion, such as 20 minutes on and 20 minutes off, can be followed.    Do not place ice directly on skin. Make sure there is a barrier between to skin and the ice pack.    Using frozen items such as frozen peas works well as the conform nicely to the are that needs to be iced.  USE AN ACE WRAP OR TED HOSE FOR SWELLING CONTROL  In addition to icing and elevation, Ace wraps or TED hose are used to help limit and resolve swelling.  It is recommended to use Ace wraps or TED hose until you are informed to stop.    When using Ace Wraps start the wrapping distally (farthest away from the body) and wrap proximally (closer to the body)   Example: If you had surgery on your leg or thing and you do not have a splint on, start the ace wrap at the toes and work your way up to the thigh        If you had surgery on your upper extremity and do not have a splint on, start the ace wrap at your fingers and work your way up to the upper arm  IF YOU ARE IN A SPLINT OR CAST DO NOT REMOVE IT FOR ANY REASON   If your splint gets wet for any reason please contact the office immediately. You may shower in your splint or cast as long as you keep it dry.  This can be done by wrapping in a cast cover or garbage back (or similar)  Do Not stick any thing down your splint or cast such as pencils, money, or hangers to try and scratch yourself with.  If you feel itchy take benadryl as prescribed on the bottle for itching  IF YOU ARE IN  A CAM BOOT (BLACK BOOT)  You may remove boot periodically. Perform daily dressing changes as noted below.  Wash the liner of the boot regularly and wear a sock when wearing the boot. It is recommended that you sleep in the boot until told otherwise    Call office for the following:  Temperature greater than 101F  Persistent nausea and vomiting  Severe uncontrolled pain  Redness, tenderness, or signs of infection (pain, swelling, redness, odor or green/yellow discharge around the site)  Difficulty breathing, headache or visual disturbances  Hives  Persistent dizziness or light-headedness  Extreme fatigue  Any other questions or concerns you may have after discharge  In an emergency, call 911 or go to an Emergency Department at a nearby hospital    CALL THE OFFICE WITH ANY QUESTIONS OR CONCERNS: [phone]   VISIT OUR WEBSITE FOR ADDITIONAL INFORMATION: [email or web address]

## 2019-12-06 NOTE — TOC Transition Note (Signed)
Transition of Care [facility]) - CM/SW Discharge Note   Patient Details  Name: Joyce Perry MRN: [id number] Date of Birth: 1946/04/14  [facility] CM/SW Contact:  Janae Bridgeman, RN Phone Number: 12/06/2019, 4:45 PM   Clinical Narrative:     Sherron Monday with [facility] and authorization given # [id number], Ref. Number [id number].  Patient to be transferred to [facility] in [place].  Please call the son to let him know about transport.  The receiving facility is [facility] - Call Lupita Leash at [facility] before sending - [phone].  Please send signed FL2, signed scripts, COVID results, discharge summary to [facility] [phone], H&P, discharge orders and the usual.     Patient Goals and CMS Choice        Discharge Placement                       Discharge Plan and Services                                     Social Determinants of Health (SDOH) Interventions     Readmission Risk Interventions No flowsheet data found.

## 2019-12-06 NOTE — Progress Notes (Signed)
Orthopaedic Trauma Service Progress Note  Patient ID: Joyce Perry MRN: [id number] DOB/AGE: [birth year] 76 y.o.  Subjective:  Doing well Pain very tolerable by her report  No specific complaints   ROS As above  Objective:   VITALS:   Vitals:   12/05/19 1348 12/05/19 2239 12/06/19 0435 12/06/19 0759  BP: 113/75 128/60 127/76 131/80  Pulse: 63 65 63 67  Resp: 18 18 18 16   Temp: 97.9 F (36.6 C) 97.7 F (36.5 C) (!) 97.5 F (36.4 C) 98.2 F (36.8 C)  TempSrc: Oral Oral Oral Oral  SpO2: 100% 96% 99% 99%  Weight:      Height:        Estimated body mass index is 18.75 kg/m as calculated from the following:   Height as of this encounter: 5\' 3"  (1.6 m).   Weight as of this encounter: 48 kg.   Intake/Output      04/08 0701 - 04/09 0700 04/09 0701 - 04/10 0700   P.O.  240   I.V. (mL/kg)     Total Intake(mL/kg)  240 (5)   Net  +240        Urine Occurrence  1 x   Stool Occurrence  1 x     LABS  Results for orders placed or performed during the hospital encounter of 12/02/19 (from the past 24 hour(s))  SARS CORONAVIRUS 2 (TAT 6-24 HRS) Nasopharyngeal Nasopharyngeal Swab     Status: None   Collection Time: 12/06/19  8:08 AM   Specimen: Nasopharyngeal Swab  Result Value Ref Range   SARS Coronavirus 2 NEGATIVE NEGATIVE     PHYSICAL EXAM:   Gen: sitting up in bedside chair, pleasant  Lungs: unlabored, clear anterior fields Cardiac: regular Abd: + BS, NT Ext:        Left Lower Extremity              incision looks great    No active drainage   No signs of infection              Minimal swelling L leg             Distal motor and sensory functions intact             No DCT              Compartments are soft              No pain with passive stretch              No knee, lower leg or ankle pain      Assessment/Plan: 3 Days Post-Op   Principal Problem:   Fracture of femoral  neck, left (HCC) Active Problems:   Chronic idiopathic constipation   History of cerebellar stroke   Controlled type 2 diabetes mellitus without complication, without long-term current use of insulin (HCC)   Vascular dementia (HCC)   DM (diabetes mellitus), type 2 (HCC)   Fracture of left hip due to osteoporosis (HCC)   Anti-infectives (From admission, onward)   Start     Dose/Rate Route Frequency Ordered Stop   12/03/19 1800  ceFAZolin (ANCEF) IVPB 2g/100 mL premix     2 g 200 mL/hr over 30 Minutes Intravenous Every 6 hours 12/03/19 1431 12/04/19 0614   12/03/19  1030  ceFAZolin (ANCEF) IVPB 2g/100 mL premix     2 g 200 mL/hr over 30 Minutes Intravenous  Once 12/03/19 0914 12/03/19 1240    .  POD/HD#: 26  74 y/o female s/p ground level fall with Left femoral neck fracture, vascular dementia    -ground level fall with fragility fracture L femoral neck    - L femoral neck fracture s/p Left hip hemiarthroplasty              WBAT Left leg with assistance             Posterior hip precautions left hip                          dc knee immobilizer              PT/OT             Dressing changes as needed    Ok to clean wound with soap and water only              Ice PRN swelling and pain    - Pain management:             Scheduled tylenol             Minimize opioids    - ABL anemia/Hemodynamics             Stable               CBC in am    - Medical issues              Per primary    - DVT/PE prophylaxis:             Lovenox x 28 days post op    - ID:              periop abx   - Metabolic Bone Disease:             Vitamin d levels look good              Fracture suggestive of osteoporosis             Recommend DEXA in 4-8 weeks to quantify              Would likely benefit from pharmacologic management of osteoporosis to prevent secondary fracture             Would also recommend aggressive therapy for strengthening and conditioning which will also help improve bone  quality and improve balance   Meets fracture liaison service criteria    - Activity:             WBAT post op with walker              Therapy evals post op   - FEN/GI prophylaxis/Foley/Lines:             reg diet              - Impediments to fracture healing:             Osteoporosis             Cognitive deficits    - Dispo:             SNF pending insurance approval   Follow up with ortho in 10-14 days       Weightbearing: WBAT LLE, posterior hip precautions L hip  Insicional and dressing care: OK to remove dressings 12/08/2019 and leave open to air with dry gauze PRN Orthopedic device(s): walker, wheelchair Showering: ok to shower/bathe and clean wounds with soap and water only  VTE prophylaxis: Lovenox 40mg  qd 28 days  Pain control: tylenol, norco  Follow - up plan: 10-14 days Contact information:  Altamese Avilla MD, Ainsley Spinner PA-C   Jari Pigg, PA-C [phone] (C) 12/06/2019, 2:23 PM  [facility] [place] [address] [phone] Domingo Sep (F)

## 2019-12-06 NOTE — Progress Notes (Signed)
Physical Therapy Treatment Patient Details Name: Joyce Perry MRN: [id number] DOB: 1946-01-10 Today's Date: 12/06/2019    History of Present Illness Pt is a 74 y/o female who presents to PT L femoral neck fracture s/p Left hip hemiarthroplasty (12/03/19). Pt is WBAT. Her past medical history is significant for vascular dementia, CVA, DM 2, & Osteoporosis. PTA patient was able to walk a flight of stairs or to mailbox with no AD.    PT Comments    Pt seated in recliner and ready to return to bed this afternoon.  She was very motivated to mobilize and was able to progress gt distance and tolerate therapeutic exercises.  Pt continues to benefit from post acute placement.  Will plan for continued gait and strengthening exercises.     Follow Up Recommendations  SNF;Supervision/Assistance - 24 hour;Supervision for mobility/OOB     Equipment Recommendations  Other (comment)(TBD at next venue)    Recommendations for Other Services       Precautions / Restrictions Precautions Precautions: Posterior Hip Precaution Booklet Issued: Yes (comment) Precaution Comments: Pt verbalized understanding Required Braces or Orthoses: Knee Immobilizer - Left Restrictions Weight Bearing Restrictions: Yes LLE Weight Bearing: Weight bearing as tolerated Other Position/Activity Restrictions: Posterior hip precautions    Mobility  Bed Mobility Overal bed mobility: Needs Assistance Bed Mobility: Sit to Supine       Sit to supine: Mod assist   General bed mobility comments: Mod assistance to lift B LEs back to bed.  Once in supine assisted with boosting to [facility].  Transfers Overall transfer level: Needs assistance Equipment used: Rolling walker (2 wheeled) Transfers: Sit to/from Stand Sit to Stand: Mod assist         General transfer comment: Cues for hand placement to and from seated surface.  Pt required assistance for boosting into standing.  Ambulation/Gait Ambulation/Gait assistance: Min  assist Gait Distance (Feet): 40 Feet Assistive device: Rolling walker (2 wheeled) Gait Pattern/deviations: Step-through pattern;Decreased stride length;Decreased stance time - left;Antalgic;Trunk flexed     General Gait Details: Initially step to pattern, progressed to step through with cues for sequencing, upper trunk control and forward gaze.   Stairs             Wheelchair Mobility    Modified Rankin (Stroke Patients Only)       Balance Overall balance assessment: Needs assistance Sitting-balance support: Feet supported;Bilateral upper extremity supported Sitting balance-Leahy Scale: Fair       Standing balance-Leahy Scale: Fair                              Cognition Arousal/Alertness: Awake/alert Behavior During Therapy: WFL for tasks assessed/performed Overall Cognitive Status: No family/caregiver present to determine baseline cognitive functioning                                 General Comments: Pt demonstrated memory deficits (requiring multiple explinations for suggested care), and multiple cues for sequencing. Able to follow simple and multistep commands inconsistently.      Exercises General Exercises - Lower Extremity Ankle Circles/Pumps: AROM;Both;10 reps;Supine Quad Sets: AROM;Left;10 reps;Supine Heel Slides: AAROM;Left;10 reps;Supine Hip ABduction/ADduction: AAROM;Left;10 reps;Supine    General Comments        Pertinent Vitals/Pain Pain Assessment: Faces Faces Pain Scale: Hurts little more Pain Location: L upper femur Pain Descriptors / Indicators: Aching;Grimacing;Guarding;Operative site guarding Pain Intervention(s): Monitored during  session;Repositioned    Home Living                      Prior Function            PT Goals (current goals can now be found in the care plan section) Acute Rehab PT Goals Patient Stated Goal: Get stronger to eventually go home PT Goal Formulation: With  patient Potential to Achieve Goals: Good Progress towards PT goals: Progressing toward goals    Frequency    Min 3X/week      PT Plan Current plan remains appropriate    Co-evaluation              AM-PAC PT "6 Clicks" Mobility   Outcome Measure  Help needed turning from your back to your side while in a flat bed without using bedrails?: A Little Help needed moving from lying on your back to sitting on the side of a flat bed without using bedrails?: A Lot Help needed moving to and from a bed to a chair (including a wheelchair)?: A Lot Help needed standing up from a chair using your arms (e.g., wheelchair or bedside chair)?: A Little Help needed to walk in hospital room?: A Little Help needed climbing 3-5 steps with a railing? : Total 6 Click Score: 14    End of Session Equipment Utilized During Treatment: Gait belt Activity Tolerance: Patient tolerated treatment well Patient left: in chair;with call bell/phone within reach;with chair alarm set;with SCD's reapplied Nurse Communication: Mobility status PT Visit Diagnosis: Unsteadiness on feet (R26.81);Muscle weakness (generalized) (M62.81);History of falling (Z91.81);Difficulty in walking, not elsewhere classified (R26.2);Pain;Other (comment) Pain - Right/Left: Left Pain - part of body: Hip     Time: 4259-5638 PT Time Calculation (min) (ACUTE ONLY): 28 min  Charges:  $Gait Training: 8-22 mins $Therapeutic Exercise: 8-22 mins                     Erasmo Leventhal , PTA Acute Rehabilitation Services Pager [phone] Office [phone]     Nekayla Heider Eli Hose 12/06/2019, 5:13 PM

## 2019-12-06 NOTE — Plan of Care (Signed)

## 2019-12-06 NOTE — Discharge Summary (Addendum)
Physician Discharge Summary  Joyce Perry ZOX:[id number] DOB: 19-Dec-1945 DOA: 12/02/2019  PCP: Kermit Balo, DO  Admit date: 12/02/2019 Discharge date: 12/07/2019  Admitted From: Home Disposition:  SNF   Recommendations for Outpatient Follow-up:  1. Follow up with Dr. Carola Frost in 10-14 days  2. Recommend DEXA in 4-8 weeks   Discharge Condition: Stable CODE STATUS: Full  Diet recommendation: Regular diet   Brief/Interim Summary: Joyce Perry is a 74 year old female with history of vascular dementia, CVA, diabetes mellitus type 2 (diet controlled) presented with left hip pain after fall at home. She was found to have left femoral neck fracture for which she underwent operative intervention on 12/03/2019 by orthopedic surgery.   Discharge Diagnoses:  Principal Problem:   Fracture of femoral neck, left (HCC) Active Problems:   Chronic idiopathic constipation   History of cerebellar stroke   Controlled type 2 diabetes mellitus without complication, without long-term current use of insulin (HCC)   Vascular dementia (HCC)   DM (diabetes mellitus), type 2 (HCC)   Fracture of left hip due to osteoporosis (HCC)   Left femoral neck fracture after a fall -Status post hemiarthroplasty of the left hip on 12/03/2019 by Dr. Carola Frost -SNF placement recommended by PT OT  -Pain control -WBAT left leg with assistance  Vascular dementia -Stable, delirium precautions  Diabetes mellitus type 2, well controlled -Hemoglobin A1c 5.3. She is not on any home medications   Discharge Instructions  Discharge Instructions    Diet general   Complete by: As directed    Increase activity slowly   Complete by: As directed      Allergies as of 12/07/2019   No Known Allergies     Medication List    STOP taking these medications   donepezil 5 MG tablet Commonly known as: Aricept     TAKE these medications   acetaminophen 325 MG tablet Commonly known as: TYLENOL Take 1-2 tablets (325-650 mg  total) by mouth every 6 (six) hours as needed for mild pain (pain score 1-3 or temp > 100.5).   enoxaparin 40 MG/0.4ML injection Commonly known as: LOVENOX Inject 0.4 mLs (40 mg total) into the skin daily for 28 days.   HYDROcodone-acetaminophen 5-325 MG tablet Commonly known as: NORCO/VICODIN Take 1-2 tablets by mouth every 4 (four) hours as needed for moderate pain (pain score 4-6).   VITAMIN B 12 PO Take 100 tablets by mouth daily.      Follow-up Information    Myrene Galas, MD. Schedule an appointment as soon as possible for a visit in 10 day(s).   Specialty: Orthopedic Surgery Contact information: [address] [place] [address] [phone]        Kermit Balo, DO. Schedule an appointment as soon as possible for a visit in 1 week(s).   Specialty: Geriatric Medicine Contact information: [address] [place] [address] [phone]          No Known Allergies  Consultations:  Orthopedic surgery    Procedures/Studies: DG Knee 2 Views Left  Result Date: 12/02/2019 CLINICAL DATA:  Larey Seat from standing position, LEFT femur pain EXAM: LEFT KNEE - 1-2 VIEW COMPARISON:  None FINDINGS: Osseous demineralization. Joint spaces preserved. No acute fracture, dislocation, or bone destruction. No joint effusion. IMPRESSION: No acute osseous abnormalities. Electronically Signed   By: Ulyses Southward M.D.   On: 12/02/2019 17:44   CT Head Wo Contrast  Result Date: 12/02/2019 CLINICAL DATA:  Acute pain due to trauma. EXAM: CT  HEAD WITHOUT CONTRAST CT CERVICAL SPINE WITHOUT CONTRAST TECHNIQUE: Multidetector CT imaging of the head and cervical spine was performed following the standard protocol without intravenous contrast. Multiplanar CT image reconstructions of the cervical spine were also generated. COMPARISON:  CT head dated July 15, 2019 FINDINGS: CT HEAD FINDINGS Brain: No evidence of acute infarction, hemorrhage, hydrocephalus, extra-axial collection or mass  lesion/mass effect. Encephalomalacia is noted involving the right cerebellum. Vascular: No hyperdense vessel or unexpected calcification. Skull: Normal. Negative for fracture or focal lesion. Sinuses/Orbits: No acute finding. Other: None. CT CERVICAL SPINE FINDINGS Alignment: Normal. Skull base and vertebrae: No acute fracture. No primary bone lesion or focal pathologic process. Soft tissues and spinal canal: No prevertebral fluid or swelling. No visible canal hematoma. Disc levels: Mild multilevel disc height loss is noted throughout the cervical spine, greatest at the C4-C5 level. Upper chest: Negative. Other: None IMPRESSION: 1. No acute intracranial abnormality. 2. No acute cervical spine fracture. Electronically Signed   By: Katherine Mantle M.D.   On: 12/02/2019 19:07   CT Cervical Spine Wo Contrast  Result Date: 12/02/2019 CLINICAL DATA:  Acute pain due to trauma. EXAM: CT HEAD WITHOUT CONTRAST CT CERVICAL SPINE WITHOUT CONTRAST TECHNIQUE: Multidetector CT imaging of the head and cervical spine was performed following the standard protocol without intravenous contrast. Multiplanar CT image reconstructions of the cervical spine were also generated. COMPARISON:  CT head dated July 15, 2019 FINDINGS: CT HEAD FINDINGS Brain: No evidence of acute infarction, hemorrhage, hydrocephalus, extra-axial collection or mass lesion/mass effect. Encephalomalacia is noted involving the right cerebellum. Vascular: No hyperdense vessel or unexpected calcification. Skull: Normal. Negative for fracture or focal lesion. Sinuses/Orbits: No acute finding. Other: None. CT CERVICAL SPINE FINDINGS Alignment: Normal. Skull base and vertebrae: No acute fracture. No primary bone lesion or focal pathologic process. Soft tissues and spinal canal: No prevertebral fluid or swelling. No visible canal hematoma. Disc levels: Mild multilevel disc height loss is noted throughout the cervical spine, greatest at the C4-C5 level. Upper  chest: Negative. Other: None IMPRESSION: 1. No acute intracranial abnormality. 2. No acute cervical spine fracture. Electronically Signed   By: Katherine Mantle M.D.   On: 12/02/2019 19:07   Chest Portable 1 View  Result Date: 12/02/2019 CLINICAL DATA:  Preoperative evaluation. EXAM: PORTABLE CHEST 1 VIEW COMPARISON:  Jan 02, 2008 FINDINGS: There is no evidence of acute infiltrate, pleural effusion or pneumothorax. A radiopaque Bobby pin is seen overlying the medial aspect of the left apex. The heart size and mediastinal contours are within normal limits. There is mild calcification of the aortic arch. A chronic fourth left rib fracture is seen. The visualized skeletal structures are otherwise unremarkable. IMPRESSION: No active disease. Electronically Signed   By: Aram Candela M.D.   On: 12/02/2019 21:40   DG Hip Port Unilat With Pelvis 1V Left  Result Date: 12/03/2019 CLINICAL DATA:  Total hip replacement for fracture EXAM: DG HIP (WITH OR WITHOUT PELVIS) 2V PORT LEFT COMPARISON:  Left hip radiographs December 02, 2019 FINDINGS: Frontal pelvis as well as lateral left hip images obtained. There is a total hip replacement on the left with prosthetic components well-seated. No fracture or dislocation. Air on the left is an expected postoperative finding. Right hip joint appears unremarkable. IMPRESSION: Status post total hip replacement on the left with prosthetic components well-seated. No fracture or dislocation. Right hip joint unremarkable. Electronically Signed   By: Bretta Bang III M.D.   On: 12/03/2019 14:38   DG Hip Unilat  W or Wo Pelvis 2-3 Views Left  Result Date: 12/02/2019 CLINICAL DATA:  Larey Seat from standing position, pain at LEFT femur EXAM: DG HIP (WITH OR WITHOUT PELVIS) 2-3V LEFT COMPARISON:  None FINDINGS: Osseous demineralization. Hip and SI joint spaces preserved. Displaced subcapital fracture LEFT femoral neck. No dislocation. Visualized pelvis intact. Degenerative disc and facet  disease changes at visualized lower lumbar spine. IMPRESSION: Displaced subcapital fracture of the LEFT femoral neck. Electronically Signed   By: Ulyses Southward M.D.   On: 12/02/2019 17:43   DG Femur Min 2 Views Left  Result Date: 12/02/2019 CLINICAL DATA:  Fall. Known femoral neck fracture. Tenderness over the mid femur. EXAM: LEFT FEMUR 2 VIEWS COMPARISON:  Hip and knee series earlier today FINDINGS: Left femoral neck fracture again noted with varus angulation. No subluxation or dislocation. No additional acute femoral abnormality. No joint effusion within the left knee. IMPRESSION: Left femoral neck fracture with varus angulation. Electronically Signed   By: Charlett Nose M.D.   On: 12/02/2019 19:56       Discharge Exam: Vitals:   12/06/19 1959 12/07/19 0612  BP: 123/73 (!) 143/86  Pulse: 91 72  Resp: 16 16  Temp: 98 F (36.7 C) 98.3 F (36.8 C)  SpO2: 100% 98%    General: Pt is alert, awake, not in acute distress Cardiovascular: RRR, S1/S2 +, no edema Respiratory: CTA bilaterally, no wheezing, no rhonchi, no respiratory distress, no conversational dyspnea  Abdominal: Soft, NT, ND, bowel sounds + Extremities: no edema, no cyanosis Psych: Normal mood and affect, hx dementia     The results of significant diagnostics from this hospitalization (including imaging, microbiology, ancillary and laboratory) are listed below for reference.     Microbiology: Recent Results (from the past 240 hour(s))  SARS CORONAVIRUS 2 (TAT 6-24 HRS) Nasopharyngeal Nasopharyngeal Swab     Status: None   Collection Time: 12/02/19  8:27 PM   Specimen: Nasopharyngeal Swab  Result Value Ref Range Status   SARS Coronavirus 2 NEGATIVE NEGATIVE Final    Comment: (NOTE) SARS-CoV-2 target nucleic acids are NOT DETECTED. The SARS-CoV-2 RNA is generally detectable in upper and lower respiratory specimens during the acute phase of infection. Negative results do not preclude SARS-CoV-2 infection, do not rule  out co-infections with other pathogens, and should not be used as the sole basis for treatment or other patient management decisions. Negative results must be combined with clinical observations, patient history, and epidemiological information. The expected result is Negative. Fact Sheet for Patients: HairSlick.no Fact Sheet for Healthcare Providers: quierodirigir.com This test is not yet approved or cleared by the [place] FDA and  has been authorized for detection and/or diagnosis of SARS-CoV-2 by FDA under an Emergency Use Authorization (EUA). This EUA will remain  in effect (meaning this test can be used) for the duration of the COVID-19 declaration under Section 56 4(b)(1) of the Act, 21 U.S.C. section 360bbb-3(b)(1), unless the authorization is terminated or revoked sooner. Performed at [facility], [address] [place], [place] [address]   Surgical pcr screen     Status: Abnormal   Collection Time: 12/03/19  8:10 AM   Specimen: Nasal Mucosa; Nasal Swab  Result Value Ref Range Status   MRSA, PCR NEGATIVE NEGATIVE Final   Staphylococcus aureus POSITIVE (A) NEGATIVE Final    Comment: (NOTE) The Xpert SA Assay (FDA approved for NASAL specimens in patients 93 years of age and older), is one component of a comprehensive surveillance program. It is  not intended to diagnose infection nor to guide or monitor treatment. Performed at [facility], [address], [place], [place] [address]   Anaerobic culture     Status: None (Preliminary result)   Collection Time: 12/03/19 12:41 PM   Specimen: Urine, Catheterized; Urinary  Result Value Ref Range Status   Specimen Description URINE, CATHETERIZED  Final   Special Requests   Final    NONE Performed at [facility], [address], [place], [place] [address]    Culture   Final    NO ANAEROBES ISOLATED; CULTURE IN PROGRESS FOR 5 DAYS   Report Status  PENDING  Incomplete  SARS CORONAVIRUS 2 (TAT 6-24 HRS) Nasopharyngeal Nasopharyngeal Swab     Status: None   Collection Time: 12/06/19  8:08 AM   Specimen: Nasopharyngeal Swab  Result Value Ref Range Status   SARS Coronavirus 2 NEGATIVE NEGATIVE Final    Comment: (NOTE) SARS-CoV-2 target nucleic acids are NOT DETECTED. The SARS-CoV-2 RNA is generally detectable in upper and lower respiratory specimens during the acute phase of infection. Negative results do not preclude SARS-CoV-2 infection, do not rule out co-infections with other pathogens, and should not be used as the sole basis for treatment or other patient management decisions. Negative results must be combined with clinical observations, patient history, and epidemiological information. The expected result is Negative. Fact Sheet for Patients: HairSlick.no Fact Sheet for Healthcare Providers: quierodirigir.com This test is not yet approved or cleared by the [place] FDA and  has been authorized for detection and/or diagnosis of SARS-CoV-2 by FDA under an Emergency Use Authorization (EUA). This EUA will remain  in effect (meaning this test can be used) for the duration of the COVID-19 declaration under Section 56 4(b)(1) of the Act, 21 U.S.C. section 360bbb-3(b)(1), unless the authorization is terminated or revoked sooner. Performed at [facility], [address] [place], [place] [address]      Labs: BNP (last 3 results) No results for input(s): BNP in the last 8760 hours. Basic Metabolic Panel: Recent Labs  Lab 12/02/19 1601 12/03/19 0510 12/04/19 0147  NA 140 135 134*  K 3.8 3.9 3.7  CL 103 102 97*  CO2 27 24 25   GLUCOSE 108* 111* 181*  BUN 15 14 11   CREATININE 0.77 0.65 0.88  CALCIUM 9.0 8.8* 8.7*   Liver Function Tests: Recent Labs  Lab 12/02/19 1601  AST 27  ALT 17  ALKPHOS 89  BILITOT 0.5  PROT 6.8  ALBUMIN 3.8   No results for  input(s): LIPASE, AMYLASE in the last 168 hours. No results for input(s): AMMONIA in the last 168 hours. CBC: Recent Labs  Lab 12/02/19 1601 12/03/19 0510 12/04/19 0147 12/05/19 0306  WBC 5.6 6.3 7.6 7.5  NEUTROABS  --   --  5.9  --   HGB 12.3 12.2 11.7* 10.3*  HCT 38.7 37.4 35.2* 31.2*  MCV 96.5 94.4 93.9 94.0  PLT 208 199 189 163   Cardiac Enzymes: No results for input(s): CKTOTAL, CKMB, CKMBINDEX, TROPONINI in the last 168 hours. BNP: Invalid input(s): POCBNP CBG: Recent Labs  Lab 12/02/19 1620 12/03/19 0917 12/03/19 1117 12/03/19 1408  GLUCAP 106* 94 101* 116*   D-Dimer No results for input(s): DDIMER in the last 72 hours. Hgb A1c No results for input(s): HGBA1C in the last 72 hours. Lipid Profile No results for input(s): CHOL, HDL, LDLCALC, TRIG, CHOLHDL, LDLDIRECT in the last 72 hours. Thyroid function studies No results for  input(s): TSH, T4TOTAL, T3FREE, THYROIDAB in the last 72 hours.  Invalid input(s): FREET3 Anemia work up No results for input(s): VITAMINB12, FOLATE, FERRITIN, TIBC, IRON, RETICCTPCT in the last 72 hours. Urinalysis    Component Value Date/Time   LABSPEC 1.015 12/12/2007 1536   PHURINE 8.5 (H) 12/12/2007 1536   GLUCOSEU NEGATIVE 12/12/2007 1536   HGBUR NEGATIVE 12/12/2007 1536   BILIRUBINUR NEGATIVE 12/12/2007 1536   KETONESUR NEGATIVE 12/12/2007 1536   PROTEINUR NEGATIVE 12/12/2007 1536   UROBILINOGEN 0.2 12/12/2007 1536   NITRITE NEGATIVE 12/12/2007 1536   LEUKOCYTESUR  12/12/2007 1536    NEGATIVE Biochemical Testing Only. Please order routine urinalysis from main lab if confirmatory testing is needed.   Sepsis Labs Invalid input(s): PROCALCITONIN,  WBC,  LACTICIDVEN Microbiology Recent Results (from the past 240 hour(s))  SARS CORONAVIRUS 2 (TAT 6-24 HRS) Nasopharyngeal Nasopharyngeal Swab     Status: None   Collection Time: 12/02/19  8:27 PM   Specimen: Nasopharyngeal Swab  Result Value Ref Range Status   SARS Coronavirus  2 NEGATIVE NEGATIVE Final    Comment: (NOTE) SARS-CoV-2 target nucleic acids are NOT DETECTED. The SARS-CoV-2 RNA is generally detectable in upper and lower respiratory specimens during the acute phase of infection. Negative results do not preclude SARS-CoV-2 infection, do not rule out co-infections with other pathogens, and should not be used as the sole basis for treatment or other patient management decisions. Negative results must be combined with clinical observations, patient history, and epidemiological information. The expected result is Negative. Fact Sheet for Patients: HairSlick.nohttps://www.fda.gov/media/138098/download Fact Sheet for Healthcare Providers: quierodirigir.comhttps://www.fda.gov/media/138095/download This test is not yet approved or cleared by the [place] FDA and  has been authorized for detection and/or diagnosis of SARS-CoV-2 by FDA under an Emergency Use Authorization (EUA). This EUA will remain  in effect (meaning this test can be used) for the duration of the COVID-19 declaration under Section 56 4(b)(1) of the Act, 21 U.S.C. section 360bbb-3(b)(1), unless the authorization is terminated or revoked sooner. Performed at [facility], [address] [place], [place] [address]   Surgical pcr screen     Status: Abnormal   Collection Time: 12/03/19  8:10 AM   Specimen: Nasal Mucosa; Nasal Swab  Result Value Ref Range Status   MRSA, PCR NEGATIVE NEGATIVE Final   Staphylococcus aureus POSITIVE (A) NEGATIVE Final    Comment: (NOTE) The Xpert SA Assay (FDA approved for NASAL specimens in patients 76 years of age and older), is one component of a comprehensive surveillance program. It is not intended to diagnose infection nor to guide or monitor treatment. Performed at [facility], [address] [place], [place] [address]   Anaerobic culture     Status: None (Preliminary result)   Collection Time: 12/03/19 12:41 PM   Specimen: Urine, Catheterized; Urinary   Result Value Ref Range Status   Specimen Description URINE, CATHETERIZED  Final   Special Requests   Final    NONE Performed at [facility], [address] [place], [place] [address]    Culture   Final    NO ANAEROBES ISOLATED; CULTURE IN PROGRESS FOR 5 DAYS   Report Status PENDING  Incomplete  SARS CORONAVIRUS 2 (TAT 6-24 HRS) Nasopharyngeal Nasopharyngeal Swab     Status: None   Collection Time: 12/06/19  8:08 AM   Specimen: Nasopharyngeal Swab  Result Value Ref Range Status   SARS Coronavirus 2 NEGATIVE NEGATIVE Final    Comment: (NOTE) SARS-CoV-2 target nucleic  acids are NOT DETECTED. The SARS-CoV-2 RNA is generally detectable in upper and lower respiratory specimens during the acute phase of infection. Negative results do not preclude SARS-CoV-2 infection, do not rule out co-infections with other pathogens, and should not be used as the sole basis for treatment or other patient management decisions. Negative results must be combined with clinical observations, patient history, and epidemiological information. The expected result is Negative. Fact Sheet for Patients: SugarRoll.be Fact Sheet for Healthcare Providers: https://www.woods-mathews.com/ This test is not yet approved or cleared by the [place] FDA and  has been authorized for detection and/or diagnosis of SARS-CoV-2 by FDA under an Emergency Use Authorization (EUA). This EUA will remain  in effect (meaning this test can be used) for the duration of the COVID-19 declaration under Section 56 4(b)(1) of the Act, 21 U.S.C. section 360bbb-3(b)(1), unless the authorization is terminated or revoked sooner. Performed at [facility], [address] [place], [place] [address]      Patient was seen and examined on the day of discharge and was found to be in stable condition. Time coordinating discharge: 25 minutes including assessment and coordination of care,  as well as examination of the patient.   SIGNED:  Dessa Phi, DO [facility] 12/07/2019, 8:17 AM

## 2019-12-06 NOTE — Progress Notes (Signed)
PROGRESS NOTE    Joyce Perry  TML:[id number] DOB: September 08, 1945 DOA: 12/02/2019 PCP: Gayland Curry, DO     Brief Narrative:  Joyce Perry is a 74 year old female with history of vascular dementia, CVA, diabetes mellitus type 2 presented with left hip pain after fall at home.  She was found to have left femoral neck fracture for which she underwent operative intervention on 12/03/2019 by orthopedic surgery.   New events last 24 hours / Subjective: Doing well overall, no new complaints   Assessment & Plan:   Principal Problem:   Fracture of femoral neck, left (HCC) Active Problems:   Chronic idiopathic constipation   History of cerebellar stroke   Controlled type 2 diabetes mellitus without complication, without long-term current use of insulin (HCC)   Vascular dementia (HCC)   DM (diabetes mellitus), type 2 (HCC)   Fracture of left hip due to osteoporosis (HCC)   Left femoral neck fracture after a fall -Status post hemiarthroplasty of the left hip on 12/03/2019 by Dr. Marcelino Scot -SNF placement pending -Pain control  Vascular dementia -Stable, delirium precautions  Diabetes mellitus type 2, well controlled -Hemoglobin A1c 5.3.  She is not on any home medications   DVT prophylaxis: Lovenox Code Status: Full code Family Communication: None at bedside; discussed with son over the phone  Disposition Plan:  . Patient is from home prior to admission. Marland Kitchen Barrier(s) to discharge include SNF placement pending.  . Suspect patient will discharge to SNF once placement and insurance auth complete. Repeat covid negative.    Consultants:   Orthopedic surgery  Procedures:   Hemiarthroplasty left hip by Dr. Marcelino Scot 4/6  Antimicrobials:  Anti-infectives (From admission, onward)   Start     Dose/Rate Route Frequency Ordered Stop   12/03/19 1800  ceFAZolin (ANCEF) IVPB 2g/100 mL premix     2 g 200 mL/hr over 30 Minutes Intravenous Every 6 hours 12/03/19 1431 12/04/19 0614    12/03/19 1030  ceFAZolin (ANCEF) IVPB 2g/100 mL premix     2 g 200 mL/hr over 30 Minutes Intravenous  Once 12/03/19 0914 12/03/19 1240       Objective: Vitals:   12/05/19 2239 12/06/19 0435 12/06/19 0759 12/06/19 1425  BP: 128/60 127/76 131/80 114/69  Pulse: 65 63 67 77  Resp: 18 18 16 17   Temp: 97.7 F (36.5 C) (!) 97.5 F (36.4 C) 98.2 F (36.8 C) (!) 97.3 F (36.3 C)  TempSrc: Oral Oral Oral Oral  SpO2: 96% 99% 99% 98%  Weight:      Height:        Intake/Output Summary (Last 24 hours) at 12/06/2019 1525 Last data filed at 12/06/2019 1406 Gross per 24 hour  Intake 360 ml  Output --  Net 360 ml   Filed Weights   12/02/19 1607  Weight: 48 kg    Examination: General exam: Appears calm and comfortable  Respiratory system: Clear to auscultation. Respiratory effort normal. Cardiovascular system: S1 & S2 heard, RRR. No pedal edema. Gastrointestinal system: Abdomen is nondistended, soft and nontender. Normal bowel sounds heard. Central nervous system: Alert. Non focal exam. Speech clear  Extremities: Symmetric in appearance bilaterally  Skin: No rashes, lesions or ulcers on exposed skin  Psychiatry: Dementia, pleasant and cooperative    Data Reviewed: I have personally reviewed following labs and imaging studies  CBC: Recent Labs  Lab 12/02/19 1601 12/03/19 0510 12/04/19 0147 12/05/19 0306  WBC 5.6 6.3 7.6 7.5  NEUTROABS  --   --  5.9  --   HGB 12.3 12.2 11.7* 10.3*  HCT 38.7 37.4 35.2* 31.2*  MCV 96.5 94.4 93.9 94.0  PLT 208 199 189 163   Basic Metabolic Panel: Recent Labs  Lab 12/02/19 1601 12/03/19 0510 12/04/19 0147  NA 140 135 134*  K 3.8 3.9 3.7  CL 103 102 97*  CO2 27 24 25   GLUCOSE 108* 111* 181*  BUN 15 14 11   CREATININE 0.77 0.65 0.88  CALCIUM 9.0 8.8* 8.7*   GFR: Estimated Creatinine Clearance: 41.2 mL/min (by C-G formula based on SCr of 0.88 mg/dL). Liver Function Tests: Recent Labs  Lab 12/02/19 1601  AST 27  ALT 17  ALKPHOS 89   BILITOT 0.5  PROT 6.8  ALBUMIN 3.8   No results for input(s): LIPASE, AMYLASE in the last 168 hours. No results for input(s): AMMONIA in the last 168 hours. Coagulation Profile: No results for input(s): INR, PROTIME in the last 168 hours. Cardiac Enzymes: No results for input(s): CKTOTAL, CKMB, CKMBINDEX, TROPONINI in the last 168 hours. BNP (last 3 results) No results for input(s): PROBNP in the last 8760 hours. HbA1C: No results for input(s): HGBA1C in the last 72 hours. CBG: Recent Labs  Lab 12/02/19 1620 12/03/19 0917 12/03/19 1117 12/03/19 1408  GLUCAP 106* 94 101* 116*   Lipid Profile: No results for input(s): CHOL, HDL, LDLCALC, TRIG, CHOLHDL, LDLDIRECT in the last 72 hours. Thyroid Function Tests: No results for input(s): TSH, T4TOTAL, FREET4, T3FREE, THYROIDAB in the last 72 hours. Anemia Panel: No results for input(s): VITAMINB12, FOLATE, FERRITIN, TIBC, IRON, RETICCTPCT in the last 72 hours. Sepsis Labs: No results for input(s): PROCALCITON, LATICACIDVEN in the last 168 hours.  Recent Results (from the past 240 hour(s))  SARS CORONAVIRUS 2 (TAT 6-24 HRS) Nasopharyngeal Nasopharyngeal Swab     Status: None   Collection Time: 12/02/19  8:27 PM   Specimen: Nasopharyngeal Swab  Result Value Ref Range Status   SARS Coronavirus 2 NEGATIVE NEGATIVE Final    Comment: (NOTE) SARS-CoV-2 target nucleic acids are NOT DETECTED. The SARS-CoV-2 RNA is generally detectable in upper and lower respiratory specimens during the acute phase of infection. Negative results do not preclude SARS-CoV-2 infection, do not rule out co-infections with other pathogens, and should not be used as the sole basis for treatment or other patient management decisions. Negative results must be combined with clinical observations, patient history, and epidemiological information. The expected result is Negative. Fact Sheet for Patients: 02/02/20 Fact Sheet for  Healthcare Providers: 02/01/20 This test is not yet approved or cleared by the [employer] FDA and  has been authorized for detection and/or diagnosis of SARS-CoV-2 by FDA under an Emergency Use Authorization (EUA). This EUA will remain  in effect (meaning this test can be used) for the duration of the COVID-19 declaration under Section 56 4(b)(1) of the Act, 21 U.S.C. section 360bbb-3(b)(1), unless the authorization is terminated or revoked sooner. Performed at [facility], [address] [place], [address] [place]   Surgical pcr screen     Status: Abnormal   Collection Time: 12/03/19  8:10 AM   Specimen: Nasal Mucosa; Nasal Swab  Result Value Ref Range Status   MRSA, PCR NEGATIVE NEGATIVE Final   Staphylococcus aureus POSITIVE (A) NEGATIVE Final    Comment: (NOTE) The Xpert SA Assay (FDA approved for NASAL specimens in patients 31 years of age and older), is one component of a comprehensive surveillance program. It is not intended to diagnose infection nor  to guide or monitor treatment. Performed at [facility], [address] [place], [place] [address]   Anaerobic culture     Status: None (Preliminary result)   Collection Time: 12/03/19 12:41 PM   Specimen: Urine, Catheterized; Urinary  Result Value Ref Range Status   Specimen Description URINE, CATHETERIZED  Final   Special Requests   Final    NONE Performed at [facility], [address] [place], [place] [address]    Culture   Final    NO ANAEROBES ISOLATED; CULTURE IN PROGRESS FOR 5 DAYS   Report Status PENDING  Incomplete  SARS CORONAVIRUS 2 (TAT 6-24 HRS) Nasopharyngeal Nasopharyngeal Swab     Status: None   Collection Time: 12/06/19  8:08 AM   Specimen: Nasopharyngeal Swab  Result Value Ref Range Status   SARS Coronavirus 2 NEGATIVE NEGATIVE Final    Comment: (NOTE) SARS-CoV-2 target nucleic acids are NOT DETECTED. The SARS-CoV-2 RNA is generally  detectable in upper and lower respiratory specimens during the acute phase of infection. Negative results do not preclude SARS-CoV-2 infection, do not rule out co-infections with other pathogens, and should not be used as the sole basis for treatment or other patient management decisions. Negative results must be combined with clinical observations, patient history, and epidemiological information. The expected result is Negative. Fact Sheet for Patients: [employer] Fact Sheet for Healthcare Providers: quierodirigir.com This test is not yet approved or cleared by the [place] FDA and  has been authorized for detection and/or diagnosis of SARS-CoV-2 by FDA under an Emergency Use Authorization (EUA). This EUA will remain  in effect (meaning this test can be used) for the duration of the COVID-19 declaration under Section 56 4(b)(1) of the Act, 21 U.S.C. section 360bbb-3(b)(1), unless the authorization is terminated or revoked sooner. Performed at [facility], [address] [place], [place] [address]       Radiology Studies: No results found.    Scheduled Meds: . acetaminophen  500 mg Oral Q8H  . Chlorhexidine Gluconate Cloth  6 each Topical Daily  . docusate sodium  100 mg Oral BID  . enoxaparin (LOVENOX) injection  40 mg Subcutaneous Q24H  . mupirocin ointment  1 application Nasal BID  . senna  1 tablet Oral BID   Continuous Infusions: . lactated ringers 10 mL/hr at 12/03/19 1454  . methocarbamol (ROBAXIN) IV       LOS: 4 days      Time spent: 20 minutes   Noralee Stain, DO [facility] 12/06/2019, 3:25 PM   Available via Epic secure chat 7am-7pm After these hours, please refer to coverage provider listed on [email or web address]

## 2019-12-07 NOTE — Progress Notes (Signed)
Discharge summary packet/pertinent documents provided to [facility] staff. Pt alert/oriented in no acute distress. No complaints.

## 2019-12-07 NOTE — Plan of Care (Signed)
  Problem: Education: Goal: Knowledge of General Education information will improve Description: Including pain rating scale, medication(s)/side effects and non-pharmacologic comfort measures Outcome: Adequate for Discharge   Problem: Clinical Measurements: Goal: Ability to maintain clinical measurements within normal limits will improve Outcome: Adequate for Discharge   Problem: Clinical Measurements: Goal: Cardiovascular complication will be avoided Outcome: Adequate for Discharge   Problem: Nutrition: Goal: Adequate nutrition will be maintained Outcome: Adequate for Discharge   Problem: Safety: Goal: Ability to remain free from injury will improve Outcome: Adequate for Discharge   Problem: Skin Integrity: Goal: Risk for impaired skin integrity will decrease Outcome: Adequate for Discharge

## 2019-12-07 NOTE — Progress Notes (Signed)
Report given to [facility] LPN. At [facility]. All questions answered.

## 2019-12-07 NOTE — TOC Transition Note (Signed)
Transition of Care [facility]) - CM/SW Discharge Note   Patient Details  Name: Joyce Perry MRN: [id number] Date of Birth: [birth year]  [facility] CM/SW Contact:  Patrice Paradise, LCSW Phone Number: [phone] 12/07/2019, 12:28 PM   Clinical Narrative:    Patient will DC to:?[facility] Anticipated DC date:?12/07/19 Family notified:?[facility] by: Sharin Mons   Per MD patient ready for DC to [facility]. RN, patient, patient's family, and facility notified of DC. Discharge Summary sent to facility. RN given number for report [phone] room 504.. DC packet on chart. Ambulance transport requested for patient.   CSW signing off.   Judd Lien, [place] [phone]    Final next level of care: Skilled Nursing Facility Barriers to Discharge: No Barriers Identified   Patient Goals and CMS Choice        Discharge Placement              Patient [facility] bed at: [facility]) Patient to be transferred to facility by: [facility] Name of family member notified: [facility] Patient and family notified of of transfer: 12/07/19  Discharge Plan and Services                                     Social Determinants of Health (SDOH) Interventions     Readmission Risk Interventions No flowsheet data found.

## 2019-12-09 LAB — ANAEROBIC CULTURE

## 2019-12-26 ENCOUNTER — Ambulatory Visit: Payer: Medicare Other | Admitting: Internal Medicine

## 2020-10-19 ENCOUNTER — Encounter: Payer: Self-pay | Admitting: Internal Medicine

## 2021-08-26 IMAGING — DX DG HIP (WITH OR WITHOUT PELVIS) 1V PORT*L*
2 series · 2 of 2 positions shown · non-contrast
Comparison: Left hip radiographs December 02, 2019

CLINICAL DATA: Total hip replacement for fracture

EXAM:
DG HIP (WITH OR WITHOUT PELVIS) 2V PORT LEFT

[pelvis ap]
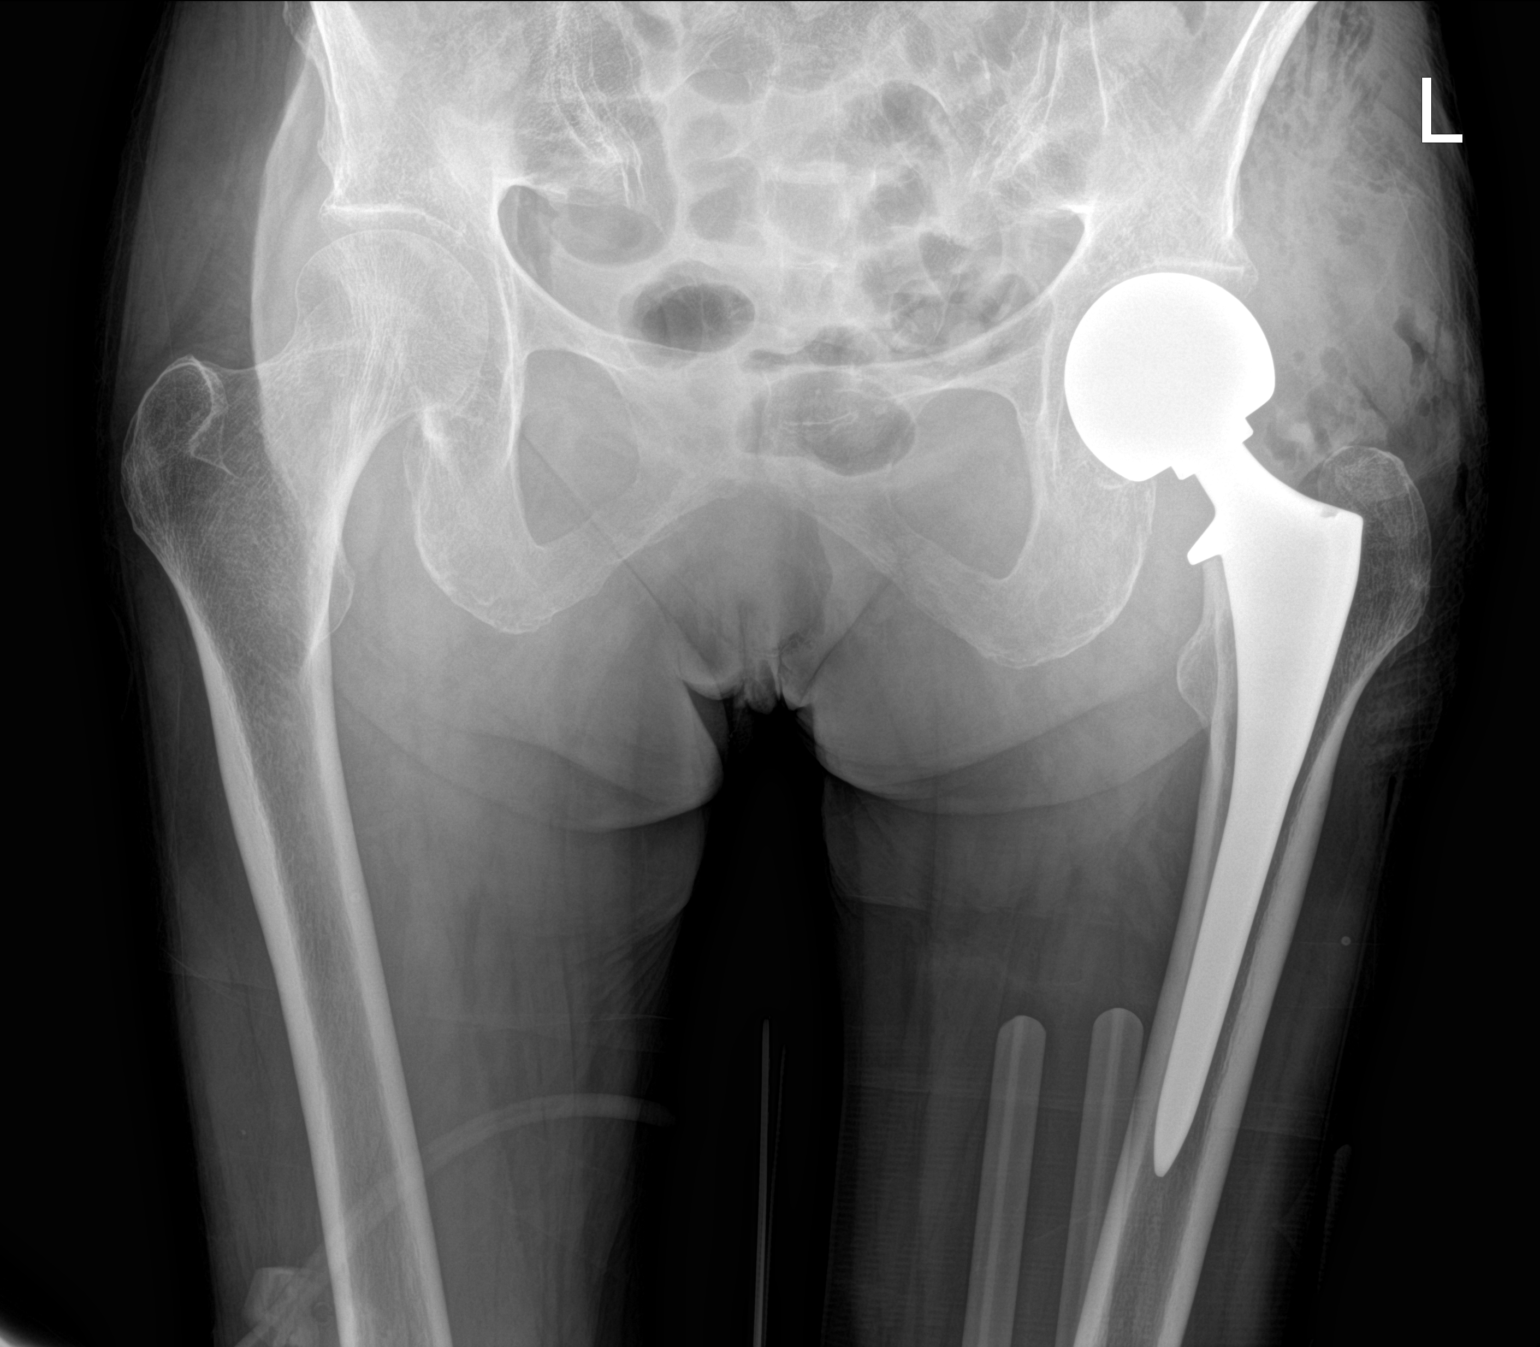

[hip lat]
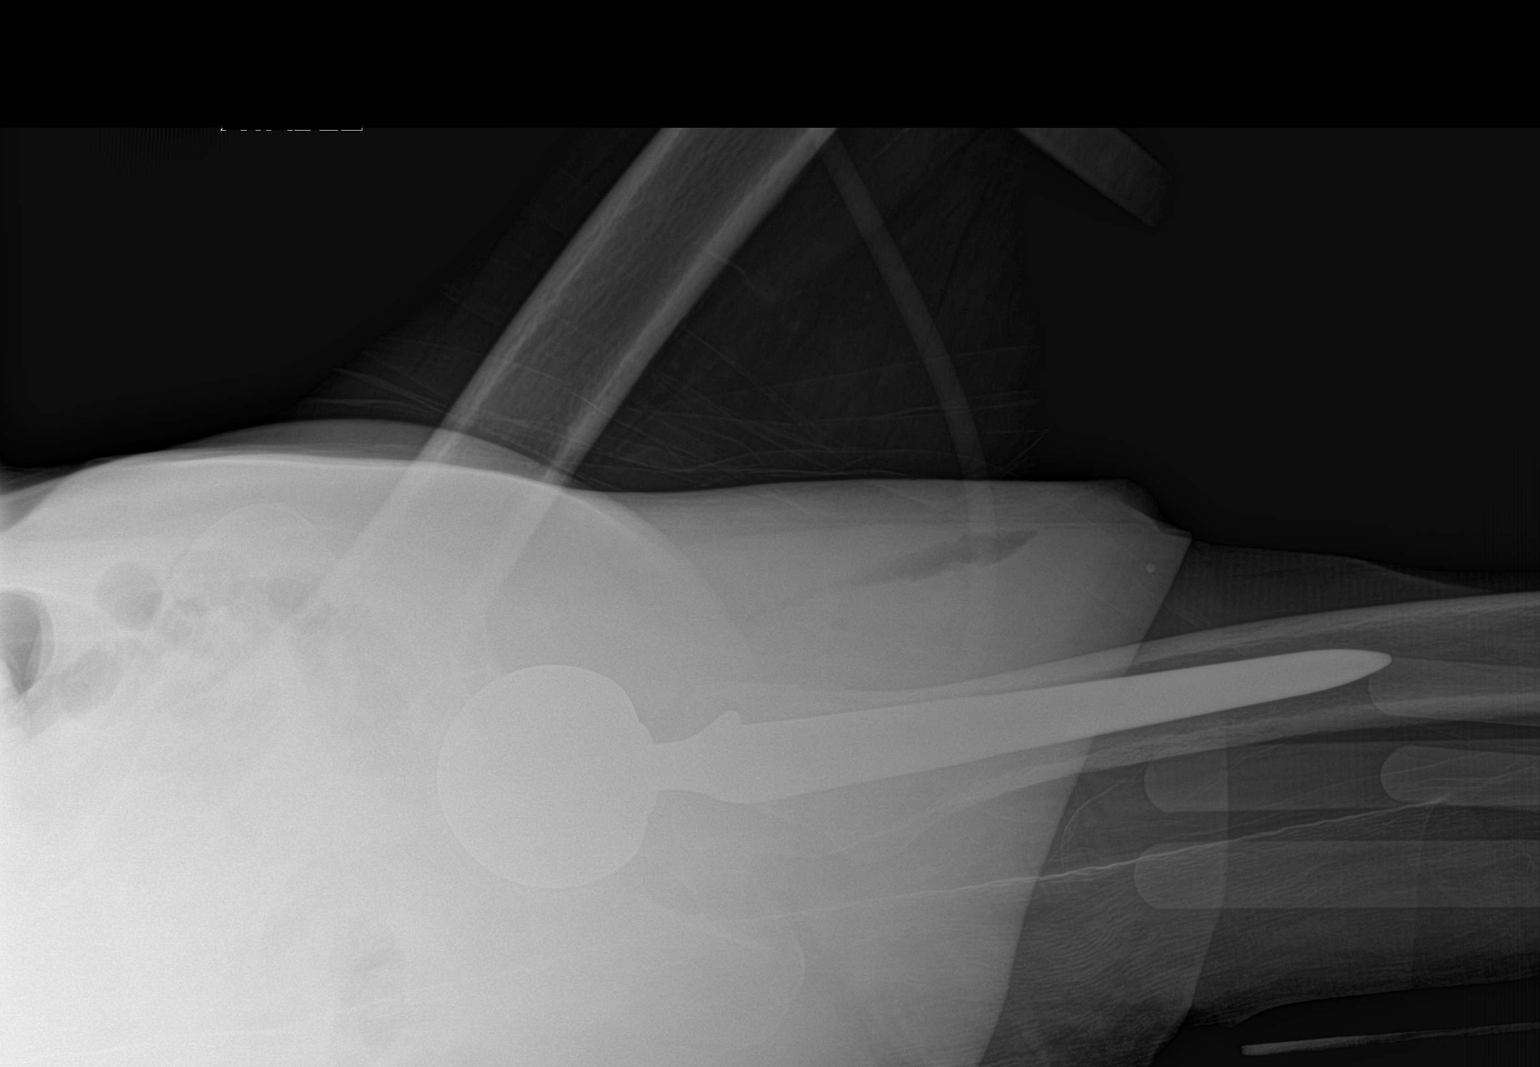

[2 of 2 positions shown; findings below may reference images not displayed]

FINDINGS: Frontal pelvis as well as lateral left hip images obtained. There is
a total hip replacement on the left with prosthetic components
well-seated. No fracture or dislocation. Air on the left is an
expected postoperative finding. Right hip joint appears
unremarkable.
IMPRESSION: Status post total hip replacement on the left with prosthetic
components well-seated. No fracture or dislocation. Right hip joint
unremarkable.
